# Patient Record
Sex: Male | Born: 1985 | Race: Black or African American | Hispanic: No | Marital: Single | Smoking: Current some day smoker
Health system: Southern US, Community
[De-identification: ages and names within clinical notes are randomized; demographics above are authoritative.]

## PROBLEM LIST (undated history)

## (undated) DIAGNOSIS — K219 Gastro-esophageal reflux disease without esophagitis: Secondary | ICD-10-CM

## (undated) HISTORY — PX: LEG SURGERY: SHX1003

## (undated) HISTORY — DX: Gastro-esophageal reflux disease without esophagitis: K21.9

## (undated) HISTORY — PX: ANKLE SURGERY: SHX546

---

## 2006-01-08 ENCOUNTER — Emergency Department (HOSPITAL_COMMUNITY): Admission: EM | Admit: 2006-01-08 | Discharge: 2006-01-08 | Payer: Self-pay | Admitting: Emergency Medicine

## 2010-01-10 ENCOUNTER — Emergency Department (HOSPITAL_COMMUNITY): Admission: EM | Admit: 2010-01-10 | Discharge: 2010-01-10 | Payer: Self-pay | Admitting: Emergency Medicine

## 2010-09-08 ENCOUNTER — Emergency Department (HOSPITAL_COMMUNITY)
Admission: EM | Admit: 2010-09-08 | Discharge: 2010-09-09 | Payer: Self-pay | Source: Home / Self Care | Admitting: Emergency Medicine

## 2011-07-20 ENCOUNTER — Emergency Department: Payer: Self-pay | Admitting: Emergency Medicine

## 2011-07-29 ENCOUNTER — Emergency Department: Payer: Self-pay | Admitting: Emergency Medicine

## 2012-02-29 ENCOUNTER — Emergency Department (HOSPITAL_COMMUNITY)
Admission: EM | Admit: 2012-02-29 | Discharge: 2012-02-29 | Disposition: A | Discharge status: Home / Self Care | Payer: Self-pay | Attending: Emergency Medicine | Admitting: Emergency Medicine

## 2012-02-29 ENCOUNTER — Encounter (HOSPITAL_COMMUNITY): Payer: Self-pay | Admitting: *Deleted

## 2012-02-29 DIAGNOSIS — I889 Nonspecific lymphadenitis, unspecified: Secondary | ICD-10-CM | POA: Insufficient documentation

## 2012-02-29 DIAGNOSIS — F17200 Nicotine dependence, unspecified, uncomplicated: Secondary | ICD-10-CM | POA: Insufficient documentation

## 2012-02-29 MED ORDER — IBUPROFEN 800 MG PO TABS: 800.0000 mg | ORAL_TABLET | Freq: Three times a day (TID) | ORAL | Status: AC

## 2012-02-29 MED ORDER — SULFAMETHOXAZOLE-TRIMETHOPRIM 800-160 MG PO TABS: 1.0000 | ORAL_TABLET | Freq: Two times a day (BID) | ORAL | Status: AC

## 2012-02-29 MED ORDER — CEPHALEXIN 500 MG PO CAPS: 500.0000 mg | ORAL_CAPSULE | Freq: Four times a day (QID) | ORAL | Status: AC

## 2012-02-29 NOTE — ED Notes (Signed)
Abscess to right groin x 1 wk.

## 2012-03-01 NOTE — ED Provider Notes (Signed)
History     CSN: [id number]  Arrival date & time 02/29/12  1402   First MD Initiated Contact with Patient 02/29/12 1416      Chief Complaint  Patient presents with  . Abscess    (Consider location/radiation/quality/duration/timing/severity/associated sxs/prior treatment) Patient is a 26 y.o. male presenting with abscess. The history is provided by the patient.  Abscess  This is a new problem. The current episode started more than one week ago. The onset was gradual. The problem has been gradually worsening. The abscess is present on the groin. The problem is moderate. The abscess is characterized by painfulness and swelling. Pertinent negatives include no fever. Associated symptoms comments: Also denies nausea, vomiting or weakness.  He does have a history of prior abscess in his left groin which healed spontaneously.Marland Kitchen    History reviewed. No pertinent past medical history.  Past Surgical History  Procedure Date  . Leg surgery left  . Ankle surgery right    No family history on file.  History  Substance Use Topics  . Smoking status: Current Everyday Smoker    Types: Cigarettes  . Smokeless tobacco: Not on file  . Alcohol Use: Yes     occasional      Review of Systems  Constitutional: Negative for fever and chills.  HENT: Negative for facial swelling.   Respiratory: Negative for shortness of breath and wheezing.   Skin: Positive for wound. Negative for color change.  Neurological: Negative for numbness.    Allergies  Review of patient's allergies indicates no known allergies.  Home Medications   Current Outpatient Rx  Name Route Sig Dispense Refill  . CEPHALEXIN 500 MG PO CAPS Oral Take 1 capsule (500 mg total) by mouth 4 (four) times daily. 28 capsule 0  . IBUPROFEN 800 MG PO TABS Oral Take 1 tablet (800 mg total) by mouth 3 (three) times daily. 21 tablet 0  . SULFAMETHOXAZOLE-TRIMETHOPRIM 800-160 MG PO TABS Oral Take 1 tablet by mouth every 12 (twelve) hours.  20 tablet 0    BP 111/74  Pulse 82  Temp 98 F (36.7 C) (Oral)  Resp 16  Ht 5\' 6"  (1.676 m)  Wt 180 lb (81.647 kg)  BMI 29.05 kg/m2  SpO2 100%  Physical Exam  Constitutional: He is oriented to person, place, and time. He appears well-developed and well-nourished.  HENT:  Head: Normocephalic.  Cardiovascular: Normal rate.   Pulmonary/Chest: Effort normal.  Musculoskeletal: He exhibits tenderness.  Neurological: He is alert and oriented to person, place, and time. No sensory deficit.  Skin:       Tender induration of 1.5 cm located in the right medial upper thigh without red streaking or surrounding erythema.  Also no tenting, pointing or active drainage.  Informal bedside ultrasound revealing for no evidence of a pus pocket collection but noted a lobulated structure suggestive of possible reactive lymph node.    ED Course  Procedures (including critical care time)  Labs Reviewed - No data to display No results found.   1. Inguinal lymphadenitis       MDM  Patient placed on Septra DS, Keflex and also given prescription for ibuprofen for pain relief.  He was encouraged to continue warm compresses to help facilitate healing process.  Patient also advised that if symptoms persist or worsen or not completely resolved within the next one to 2 weeks he should get this rechecked either by seeing PCP or return here for reevaluation.  Patient understands plan and will return  if this pain and swelling do not completely resolve.        Burgess Amor, [place] 03/01/12 [phone]

## 2012-03-02 NOTE — ED Provider Notes (Signed)
Medical screening examination/treatment/procedure(s) were performed by non-physician practitioner and as supervising physician I was immediately available for consultation/collaboration.   Murlean Seelye, MD 03/02/12 1315 

## 2012-03-10 ENCOUNTER — Encounter (HOSPITAL_COMMUNITY): Payer: Self-pay | Admitting: *Deleted

## 2012-03-10 ENCOUNTER — Emergency Department (HOSPITAL_COMMUNITY)
Admission: EM | Admit: 2012-03-10 | Discharge: 2012-03-10 | Disposition: A | Discharge status: Home / Self Care | Payer: Self-pay | Attending: Emergency Medicine | Admitting: Emergency Medicine

## 2012-03-10 ENCOUNTER — Emergency Department (HOSPITAL_COMMUNITY): Payer: Self-pay

## 2012-03-10 DIAGNOSIS — F17200 Nicotine dependence, unspecified, uncomplicated: Secondary | ICD-10-CM | POA: Insufficient documentation

## 2012-03-10 DIAGNOSIS — S9032XA Contusion of left foot, initial encounter: Secondary | ICD-10-CM

## 2012-03-10 DIAGNOSIS — M79609 Pain in unspecified limb: Secondary | ICD-10-CM | POA: Insufficient documentation

## 2012-03-10 DIAGNOSIS — S9030XA Contusion of unspecified foot, initial encounter: Secondary | ICD-10-CM | POA: Insufficient documentation

## 2012-03-10 DIAGNOSIS — W208XXA Other cause of strike by thrown, projected or falling object, initial encounter: Secondary | ICD-10-CM | POA: Insufficient documentation

## 2012-03-10 DIAGNOSIS — Z79899 Other long term (current) drug therapy: Secondary | ICD-10-CM | POA: Insufficient documentation

## 2012-03-10 MED ORDER — HYDROCODONE-ACETAMINOPHEN 5-325 MG PO TABS: 1.0000 | ORAL_TABLET | ORAL | Status: DC | PRN

## 2012-03-10 MED ORDER — IBUPROFEN 800 MG PO TABS: 800.0000 mg | ORAL_TABLET | Freq: Three times a day (TID) | ORAL | Status: AC | PRN

## 2012-03-10 MED ORDER — HYDROCODONE-ACETAMINOPHEN 5-325 MG PO TABS
1.0000 | ORAL_TABLET | Freq: Once | ORAL | Status: AC
  Administered 2012-03-10: 1 via ORAL
  Filled 2012-03-10: qty 1

## 2012-03-10 MED ORDER — IBUPROFEN 800 MG PO TABS
800.0000 mg | ORAL_TABLET | Freq: Once | ORAL | Status: AC
  Administered 2012-03-10: 800 mg via ORAL
  Filled 2012-03-10: qty 1

## 2012-03-10 MED ORDER — IBUPROFEN 800 MG PO TABS: 800.0000 mg | ORAL_TABLET | Freq: Once | ORAL | Status: DC

## 2012-03-10 MED ORDER — HYDROCODONE-ACETAMINOPHEN 5-325 MG PO TABS: 1.0000 | ORAL_TABLET | ORAL | Status: AC | PRN

## 2012-03-10 NOTE — ED Notes (Signed)
Pt alert & oriented x4, stable gait. Patient given discharge instructions, paperwork & prescription(s). Patient  instructed to stop at the registration desk to finish any additional paperwork. Patient verbalized understanding. Pt left department w/ no further questions. 

## 2012-03-10 NOTE — ED Notes (Signed)
Pt reports was lifting a concrete slab and it fell on his left foot.  Reports foot is swollen.  No numbness.  Reports throbbing pain.

## 2012-03-10 NOTE — ED Notes (Signed)
Ice pack applied to left foot. 

## 2012-03-11 NOTE — ED Provider Notes (Signed)
History     CSN: [id number]  Arrival date & time 03/10/12  4098   First MD Initiated Contact with Patient 03/10/12 2009      Chief Complaint  Patient presents with  . Foot Injury    (Consider location/radiation/quality/duration/timing/severity/associated sxs/prior treatment) Patient is a 26 y.o. male presenting with foot injury. The history is provided by the patient.  Foot Injury  The incident occurred less than 1 hour ago. The incident occurred at home. The injury mechanism was a direct blow (He dropped a concrete well cover on his left foot). The pain is present in the left foot. The quality of the pain is described as sharp and throbbing. The pain is at a severity of 8/10. The pain is moderate. The pain has been constant since onset. Pertinent negatives include no numbness, no loss of sensation and no tingling. Associated symptoms comments: He has pain localize to his arch area,  He can bear weight if he steps on the "outside" of his foot.Marland Kitchen He reports no foreign bodies present. The symptoms are aggravated by bearing weight and palpation.    History reviewed. No pertinent past medical history.  Past Surgical History  Procedure Date  . Leg surgery left  . Ankle surgery right    History reviewed. No pertinent family history.  History  Substance Use Topics  . Smoking status: Current Everyday Smoker    Types: Cigarettes  . Smokeless tobacco: Not on file  . Alcohol Use: Yes     occasional      Review of Systems  Musculoskeletal: Positive for joint swelling and arthralgias.  Skin: Negative for wound.  Neurological: Negative for tingling, weakness and numbness.    Allergies  Review of patient's allergies indicates no known allergies.  Home Medications   Current Outpatient Rx  Name Route Sig Dispense Refill  . CEPHALEXIN 500 MG PO CAPS Oral Take 1 capsule (500 mg total) by mouth 4 (four) times daily. 28 capsule 0  . IBUPROFEN 800 MG PO TABS Oral Take 1 tablet (800 mg  total) by mouth 3 (three) times daily. 21 tablet 0  . SULFAMETHOXAZOLE-TRIMETHOPRIM 800-160 MG PO TABS Oral Take 1 tablet by mouth every 12 (twelve) hours. 20 tablet 0  . HYDROCODONE-ACETAMINOPHEN 5-325 MG PO TABS Oral Take 1 tablet by mouth every 4 (four) hours as needed for pain. 20 tablet 0  . IBUPROFEN 800 MG PO TABS Oral Take 1 tablet (800 mg total) by mouth every 8 (eight) hours as needed (swelling). 15 tablet 0    BP 119/71  Pulse 99  Temp 98.4 F (36.9 C) (Oral)  Resp 18  Ht 5\' 6"  (1.676 m)  Wt 180 lb (81.647 kg)  BMI 29.05 kg/m2  SpO2 99%  Physical Exam  Constitutional: He appears well-developed and well-nourished.  HENT:  Head: Atraumatic.  Neck: Normal range of motion.  Cardiovascular:       Pulses equal bilaterally  Musculoskeletal: He exhibits edema and tenderness.       Left foot: He exhibits tenderness, bony tenderness and swelling. He exhibits normal range of motion, normal capillary refill, no crepitus and no deformity.       Feet:  Neurological: He is alert. He has normal strength. He displays normal reflexes. No sensory deficit.       Equal strength  Skin: Skin is warm and dry.  Psychiatric: He has a normal mood and affect.    ED Course  Procedures (including critical care time)  Labs Reviewed - No  data to display Dg Foot Complete Left  03/10/2012  *RADIOLOGY REPORT*  Clinical Data: Question injury with first metatarsal pain.  LEFT FOOT - COMPLETE 3+ VIEW  Comparison: None.  Findings: Postoperative changes are centered at the first tarsometatarsal joint, with a nondisplaced and fractured screw entering the base of the second metatarsal.  Postoperative changes are seen in the distal tibia and fibula as well.  No evidence of an acute fracture.  IMPRESSION: Postoperative changes in the foot without evidence of acute osseous abnormality.  Original Report Authenticated By: Reyes Ivan, M.D.     1. Contusion of left foot       MDM  Patients labs  and/or radiological studies were reviewed during the medical decision making and disposition process. Ace wrap,  Crutches supplied.  RICE,  Ibuprofen and hydrocodone prescribed.  Recheck by ortho if not improved over the next week.  Pt understands plan.        Burgess Amor, [place] 03/11/12 1442

## 2012-03-14 NOTE — ED Provider Notes (Signed)
Medical screening examination/treatment/procedure(s) were performed by non-physician practitioner and as supervising physician I was immediately available for consultation/collaboration. Devoria Albe, MD, Armando Gang   Ward Givens, MD 03/14/12 [phone]

## 2012-05-12 ENCOUNTER — Emergency Department (HOSPITAL_COMMUNITY)
Admission: EM | Admit: 2012-05-12 | Discharge: 2012-05-12 | Disposition: A | Discharge status: Home / Self Care | Payer: Self-pay | Attending: Emergency Medicine | Admitting: Emergency Medicine

## 2012-05-12 ENCOUNTER — Encounter (HOSPITAL_COMMUNITY): Payer: Self-pay | Admitting: *Deleted

## 2012-05-12 DIAGNOSIS — K529 Noninfective gastroenteritis and colitis, unspecified: Secondary | ICD-10-CM

## 2012-05-12 DIAGNOSIS — F17200 Nicotine dependence, unspecified, uncomplicated: Secondary | ICD-10-CM | POA: Insufficient documentation

## 2012-05-12 DIAGNOSIS — K5289 Other specified noninfective gastroenteritis and colitis: Secondary | ICD-10-CM | POA: Insufficient documentation

## 2012-05-12 LAB — CBC WITH DIFFERENTIAL/PLATELET
Basophils Absolute: 0 10*3/uL (ref 0.0–0.1)
Hemoglobin: 15.2 g/dL (ref 13.0–17.0)
Lymphocytes Relative: 12 % (ref 12–46)
Lymphs Abs: 0.4 10*3/uL — ABNORMAL LOW (ref 0.7–4.0)
MCH: 27.4 pg (ref 26.0–34.0)
MCHC: 34.2 g/dL (ref 30.0–36.0)
Monocytes Absolute: 0.5 10*3/uL (ref 0.1–1.0)
WBC: 3.7 10*3/uL — ABNORMAL LOW (ref 4.0–10.5)

## 2012-05-12 LAB — BASIC METABOLIC PANEL
Creatinine, Ser: 1.26 mg/dL (ref 0.50–1.35)
GFR calc Af Amer: >90 mL/min (ref >90)
Potassium: 3.1 mEq/L — ABNORMAL LOW (ref 3.5–5.1)

## 2012-05-12 MED ORDER — PROMETHAZINE HCL 25 MG PO TABS: 25.0000 mg | ORAL_TABLET | Freq: Four times a day (QID) | ORAL | Status: DC | PRN

## 2012-05-12 MED ORDER — SODIUM CHLORIDE 0.9 % IV BOLUS (SEPSIS)
1000.0000 mL | Freq: Once | INTRAVENOUS | Status: AC
  Administered 2012-05-12: 1000 mL via INTRAVENOUS

## 2012-05-12 MED ORDER — PANTOPRAZOLE SODIUM 40 MG IV SOLR
40.0000 mg | Freq: Once | INTRAVENOUS | Status: AC
  Administered 2012-05-12: 40 mg via INTRAVENOUS
  Filled 2012-05-12: qty 40

## 2012-05-12 MED ORDER — METOCLOPRAMIDE HCL 5 MG/ML IJ SOLN
10.0000 mg | Freq: Once | INTRAMUSCULAR | Status: AC
  Administered 2012-05-12: 10 mg via INTRAVENOUS
  Filled 2012-05-12: qty 2

## 2012-05-12 MED ORDER — ONDANSETRON HCL 4 MG/2ML IJ SOLN
4.0000 mg | Freq: Once | INTRAMUSCULAR | Status: AC
  Administered 2012-05-12: 4 mg via INTRAVENOUS
  Filled 2012-05-12: qty 2

## 2012-05-12 NOTE — ED Provider Notes (Signed)
History  This chart was scribed for Donnetta Hutching, MD by Shari Heritage. The patient was seen in room APA05/APA05. Patient's care was started at 0910.     CSN: [id number]  Arrival date & time 05/12/12  6440   First MD Initiated Contact with Patient 05/12/12 [phone]      Chief Complaint  Patient presents with  . Emesis    The history is provided by the patient. No language interpreter was used.    Brandon Page is a 26 y.o. male who presents to the Emergency Department complaining of persistent emesis onset 3 days ago. Associated symptoms include an occipital HA that radiates to the left side of his neck , diaphoresis, chills, diarrhea, and abdominal pain. He denies hematemesis. Patient states that he has had 4-5 episodes of emesis every day since Tuesday that only occur in the evenings. He says that he has very few symptoms during the day, but when he tries to sleep, he begins feeling nauseated and symptoms begin. He reports no known sick contacts. No pertinent past medical history. Patient is a current everyday smoker.  History reviewed. No pertinent past medical history.  Past Surgical History  Procedure Date  . Leg surgery left  . Ankle surgery right    No family history on file.  History  Substance Use Topics  . Smoking status: Current Every Day Smoker    Types: Cigarettes  . Smokeless tobacco: Not on file  . Alcohol Use: Yes     occasional      Review of Systems A complete 10 system review of systems was obtained and all systems are negative except as noted in the HPI and PMH.   Allergies  Review of patient's allergies indicates no known allergies.  Home Medications  No current outpatient prescriptions on file.  BP 120/74  Pulse 120  Temp 99 F (37.2 C) (Oral)  Resp 16  Ht 5\' 6"  (1.676 m)  Wt 180 lb (81.647 kg)  BMI 29.05 kg/m2  SpO2 96%  Physical Exam  Nursing note and vitals reviewed. Constitutional: He is oriented to person, place, and time. He appears  well-developed and well-nourished.  HENT:  Head: Normocephalic and atraumatic.  Eyes: Conjunctivae normal and EOM are normal. Pupils are equal, round, and reactive to light.  Neck: Normal range of motion. Neck supple.  Cardiovascular: Normal rate, regular rhythm and normal heart sounds.   Pulmonary/Chest: Effort normal and breath sounds normal.  Abdominal: Soft. Bowel sounds are normal.  Musculoskeletal: Normal range of motion.  Neurological: He is alert and oriented to person, place, and time.  Skin: Skin is warm and dry.  Psychiatric: He has a normal mood and affect.    ED Course  Procedures (including critical care time) DIAGNOSTIC STUDIES: Oxygen Saturation is 96% on room air, adequate by my interpretation.    COORDINATION OF CARE: 9:55am- Patient informed of current plan for treatment and evaluation and agrees with plan at this time. Will administer IV fluids, Zofran, Protonix and Reglan.   Results for orders placed during the hospital encounter of 05/12/12  CBC WITH DIFFERENTIAL      Component Value Range   WBC 3.7 (*) 4.0 - 10.5 K/uL   RBC 5.54  4.22 - 5.81 MIL/uL   Hemoglobin 15.2  13.0 - 17.0 g/dL   HCT 25.9  56.3 - 87.5 %   MCV 80.1  78.0 - 100.0 fL   MCH 27.4  26.0 - 34.0 pg   MCHC 34.2  30.0 -  36.0 g/dL   RDW 19.1  47.8 - 29.5 %   Platelets 192  150 - 400 K/uL   Neutrophils Relative 74  43 - 77 %   Neutro Abs 2.7  1.7 - 7.7 K/uL   Lymphocytes Relative 12  12 - 46 %   Lymphs Abs 0.4 (*) 0.7 - 4.0 K/uL   Monocytes Relative 14 (*) 3 - 12 %   Monocytes Absolute 0.5  0.1 - 1.0 K/uL   Eosinophils Relative 0  0 - 5 %   Eosinophils Absolute 0.0  0.0 - 0.7 K/uL   Basophils Relative 0  0 - 1 %   Basophils Absolute 0.0  0.0 - 0.1 K/uL  BASIC METABOLIC PANEL      Component Value Range   Sodium 133 (*) 135 - 145 mEq/L   Potassium 3.1 (*) 3.5 - 5.1 mEq/L   Chloride 94 (*) 96 - 112 mEq/L   CO2 26  19 - 32 mEq/L   Glucose, Bld 141 (*) 70 - 99 mg/dL   BUN 10  6 - 23  mg/dL   Creatinine, Ser 6.21  0.50 - 1.35 mg/dL   Calcium 9.4  8.4 - 30.8 mg/dL   GFR calc non Af Amer 78 (*) >90 mL/min   GFR calc Af Amer >90  >90 mL/min    No results found.   No diagnosis found.    MDM  No clinical evidence of meningitis. Patient feeling better after IV fluids.discharge home with Phenergan      I personally performed the services described in this documentation, which was scribed in my presence. The recorded information has been reviewed and considered.    Donnetta Hutching, MD 05/12/12 1356

## 2012-05-12 NOTE — ED Notes (Signed)
Pt states fever, chills, vomiting (only at night), and diarrhea since Tuesday. Pain to back of neck and shoulders.

## 2012-08-28 ENCOUNTER — Encounter (HOSPITAL_COMMUNITY): Payer: Self-pay

## 2012-08-28 ENCOUNTER — Emergency Department (HOSPITAL_COMMUNITY)
Admission: EM | Admit: 2012-08-28 | Discharge: 2012-08-29 | Disposition: A | Discharge status: Home / Self Care | Payer: Self-pay | Attending: Emergency Medicine | Admitting: Emergency Medicine

## 2012-08-28 DIAGNOSIS — M2550 Pain in unspecified joint: Secondary | ICD-10-CM | POA: Insufficient documentation

## 2012-08-28 DIAGNOSIS — L02414 Cutaneous abscess of left upper limb: Secondary | ICD-10-CM

## 2012-08-28 DIAGNOSIS — F17200 Nicotine dependence, unspecified, uncomplicated: Secondary | ICD-10-CM | POA: Insufficient documentation

## 2012-08-28 DIAGNOSIS — IMO0002 Reserved for concepts with insufficient information to code with codable children: Secondary | ICD-10-CM | POA: Insufficient documentation

## 2012-08-28 DIAGNOSIS — Z7982 Long term (current) use of aspirin: Secondary | ICD-10-CM | POA: Insufficient documentation

## 2012-08-28 MED ORDER — SULFAMETHOXAZOLE-TMP DS 800-160 MG PO TABS
1.0000 | ORAL_TABLET | Freq: Once | ORAL | Status: AC
  Administered 2012-08-29: 1 via ORAL
  Filled 2012-08-28: qty 1

## 2012-08-28 MED ORDER — CEFTRIAXONE SODIUM 1 G IJ SOLR
1.0000 g | Freq: Once | INTRAMUSCULAR | Status: AC
  Administered 2012-08-29: 1 g via INTRAMUSCULAR
  Filled 2012-08-28: qty 10

## 2012-08-28 MED ORDER — PROMETHAZINE HCL 12.5 MG PO TABS
12.5000 mg | ORAL_TABLET | Freq: Once | ORAL | Status: AC
  Administered 2012-08-29: 12.5 mg via ORAL
  Filled 2012-08-28: qty 1

## 2012-08-28 MED ORDER — HYDROCODONE-ACETAMINOPHEN 5-325 MG PO TABS
2.0000 | ORAL_TABLET | Freq: Once | ORAL | Status: AC
  Administered 2012-08-29: 2 via ORAL
  Filled 2012-08-28: qty 2

## 2012-08-28 NOTE — ED Notes (Signed)
Pt c/o abscess to left forearm that started this morning.

## 2012-08-29 MED ORDER — AMOXICILLIN 500 MG PO CAPS: 500.0000 mg | ORAL_CAPSULE | Freq: Three times a day (TID) | ORAL | Status: DC

## 2012-08-29 MED ORDER — SULFAMETHOXAZOLE-TRIMETHOPRIM 800-160 MG PO TABS: 1.0000 | ORAL_TABLET | Freq: Two times a day (BID) | ORAL | Status: DC

## 2012-08-29 MED ORDER — HYDROCODONE-ACETAMINOPHEN 5-325 MG PO TABS: ORAL_TABLET | ORAL | Status: DC

## 2012-08-29 MED ORDER — LIDOCAINE HCL (PF) 1 % IJ SOLN
5.0000 mL | Freq: Once | INTRAMUSCULAR | Status: AC
  Administered 2012-08-29: 2.2 mL

## 2012-08-29 NOTE — ED Notes (Signed)
No reaction to medication administered - home with significant other

## 2012-08-29 NOTE — ED Provider Notes (Signed)
History     CSN: [id number]  Arrival date & time 08/28/12  2232   First MD Initiated Contact with Patient 08/28/12 2349      Chief Complaint  Patient presents with  . Abscess    (Consider location/radiation/quality/duration/timing/severity/associated sxs/prior treatment) Patient is a 26 y.o. male presenting with abscess. The history is provided by the patient.  Abscess  This is a new problem. The current episode started yesterday. The problem occurs continuously. The problem has been gradually worsening. The abscess is present on the left arm. The problem is moderate. The abscess is characterized by redness and painfulness. It is unknown what he was exposed to. Pertinent negatives include no fever, no vomiting and no cough. His past medical history does not include skin abscesses in family. There were no sick contacts.    History reviewed. No pertinent past medical history.  Past Surgical History  Procedure Date  . Leg surgery left  . Ankle surgery right    No family history on file.  History  Substance Use Topics  . Smoking status: Current Every Day Smoker    Types: Cigarettes  . Smokeless tobacco: Not on file  . Alcohol Use: Yes     Comment: occasional      Review of Systems  Constitutional: Negative for fever and activity change.       All ROS Neg except as noted in HPI  HENT: Negative for nosebleeds and neck pain.   Eyes: Negative for photophobia and discharge.  Respiratory: Negative for cough, shortness of breath and wheezing.   Cardiovascular: Negative for chest pain and palpitations.  Gastrointestinal: Negative for vomiting, abdominal pain and blood in stool.  Genitourinary: Negative for dysuria, frequency and hematuria.  Musculoskeletal: Positive for arthralgias. Negative for back pain.  Skin: Negative.        abscess  Neurological: Negative for dizziness, seizures and speech difficulty.  Psychiatric/Behavioral: Negative for hallucinations and confusion.     Allergies  Review of patient's allergies indicates no known allergies.  Home Medications   Current Outpatient Rx  Name  Route  Sig  Dispense  Refill  . ASPIRIN-ACETAMINOPHEN-CAFFEINE 250-250-65 MG PO TABS   Oral   Take 2 tablets by mouth every 6 (six) hours as needed. For pain         . THERAFLU FLU/COLD PO   Oral   Take 1 Package by mouth daily as needed. For cold symptoms         . PROMETHAZINE HCL 25 MG PO TABS   Oral   Take 1 tablet (25 mg total) by mouth every 6 (six) hours as needed for nausea.   15 tablet   0     BP 131/79  Pulse 86  Temp 98 F (36.7 C) (Oral)  Resp 18  SpO2 100%  Physical Exam  Nursing note and vitals reviewed. Constitutional: He is oriented to person, place, and time. He appears well-developed and well-nourished.  Non-toxic appearance.  HENT:  Head: Normocephalic.  Right Ear: Tympanic membrane and external ear normal.  Left Ear: Tympanic membrane and external ear normal.  Eyes: EOM and lids are normal. Pupils are equal, round, and reactive to light.  Neck: Normal range of motion. Neck supple. Carotid bruit is not present.  Cardiovascular: Normal rate, regular rhythm, normal heart sounds, intact distal pulses and normal pulses.   Pulmonary/Chest: Breath sounds normal. No respiratory distress.  Abdominal: Soft. Bowel sounds are normal. There is no tenderness. There is no guarding.  Musculoskeletal: Normal  range of motion.       Small abscess of the left mid forearm. Small area of redness around the abscess. Soreness of the bicep/tricep area with movement.  Lymphadenopathy:       Head (right side): No submandibular adenopathy present.       Head (left side): No submandibular adenopathy present.    He has no cervical adenopathy.  Neurological: He is alert and oriented to person, place, and time. He has normal strength. No cranial nerve deficit or sensory deficit.  Skin: Skin is warm and dry.  Psychiatric: He has a normal mood and  affect. His speech is normal.    ED Course  Procedures (including critical care time)  Labs Reviewed - No data to display No results found.   No diagnosis found.    MDM  I have reviewed nursing notes, vital signs, and all appropriate lab and imaging results for this patient. Pt has a small abscess with small area of cellulitis around it. Pt advised to use septra and amoxil daily. Norco for pain if needed. Pt is to use salt water soaks daily. He is to return if any changes or problem.       Kathie Dike, [place] 08/29/12 [phone]

## 2012-09-01 NOTE — ED Provider Notes (Signed)
Medical screening examination/treatment/procedure(s) were performed by non-physician practitioner and as supervising physician I was immediately available for consultation/collaboration.  Jacqulynn Shappell, MD 09/01/12 0141 

## 2013-06-20 IMAGING — CR DG FOOT COMPLETE 3+V*L*
3 series · 3 of 3 positions shown · non-contrast
Comparison: None.

CLINICAL DATA: Question injury with first metatarsal pain.

LEFT FOOT - COMPLETE 3+ VIEW

[view not recorded (1 of 3)]
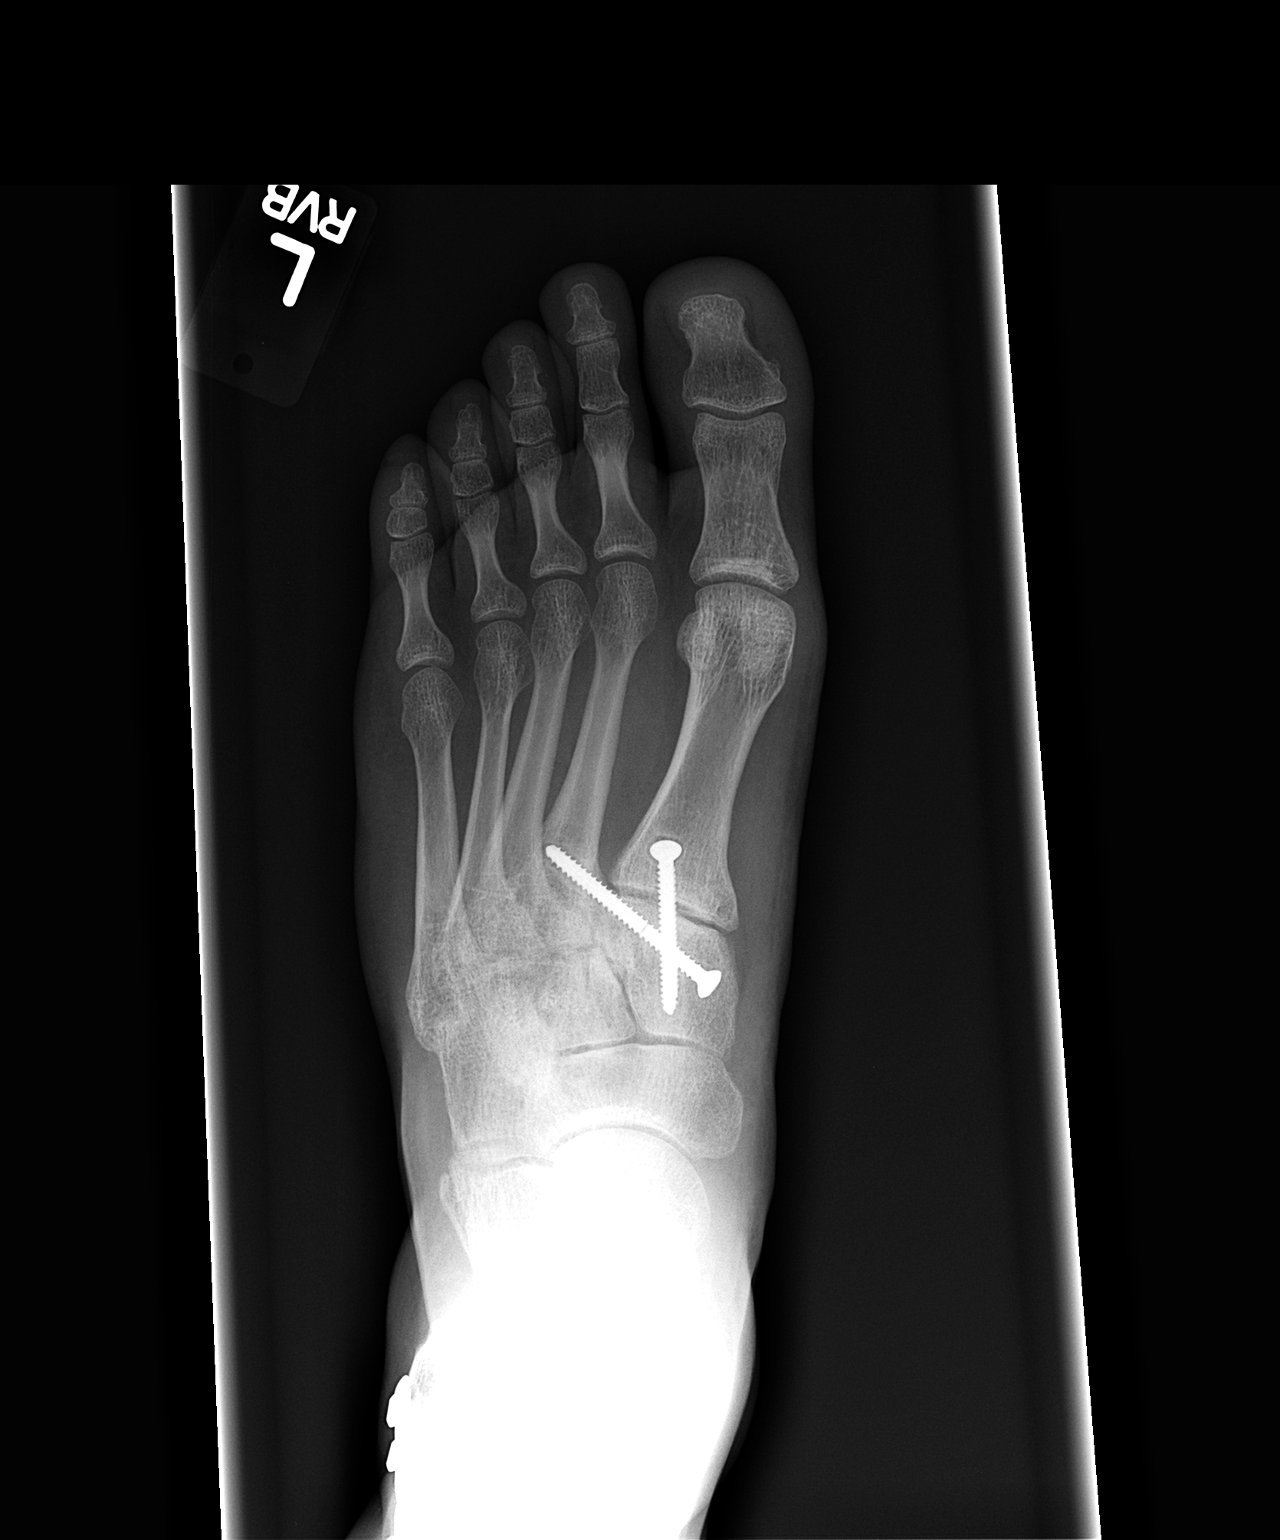

[view not recorded (2 of 3)]
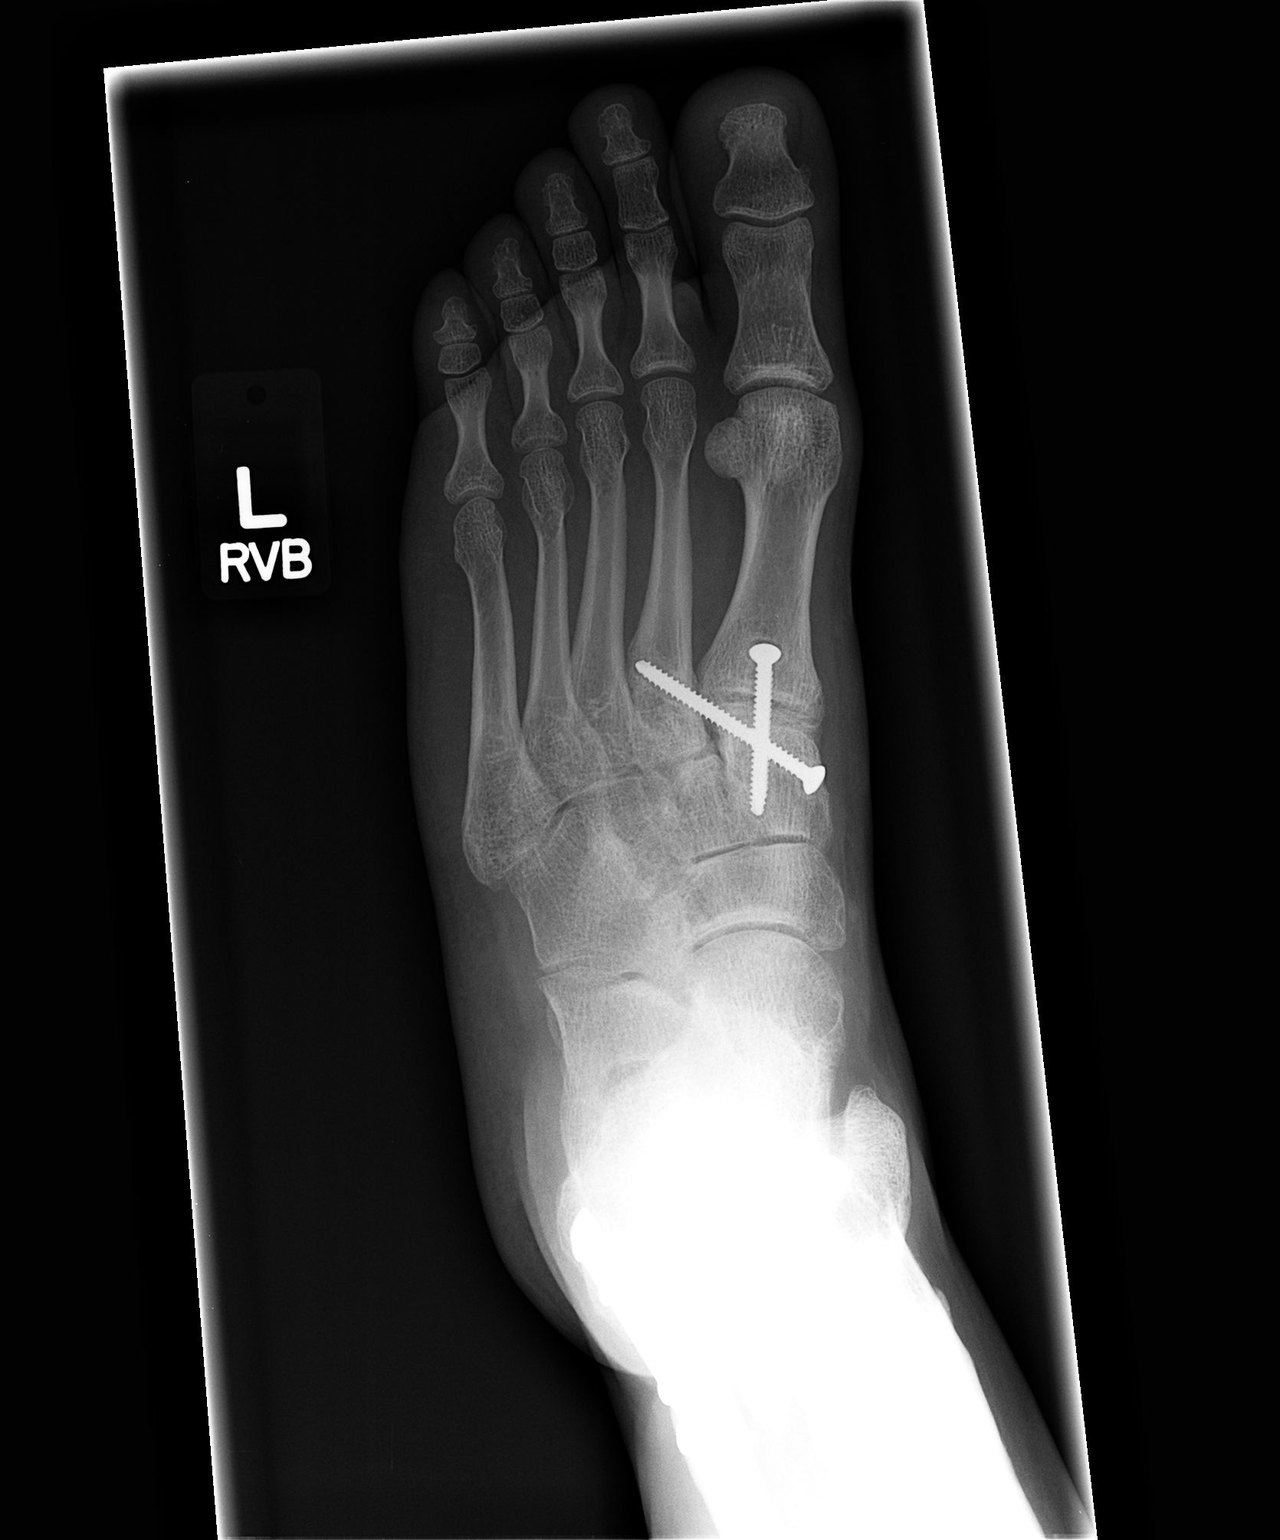

[view not recorded (3 of 3)]
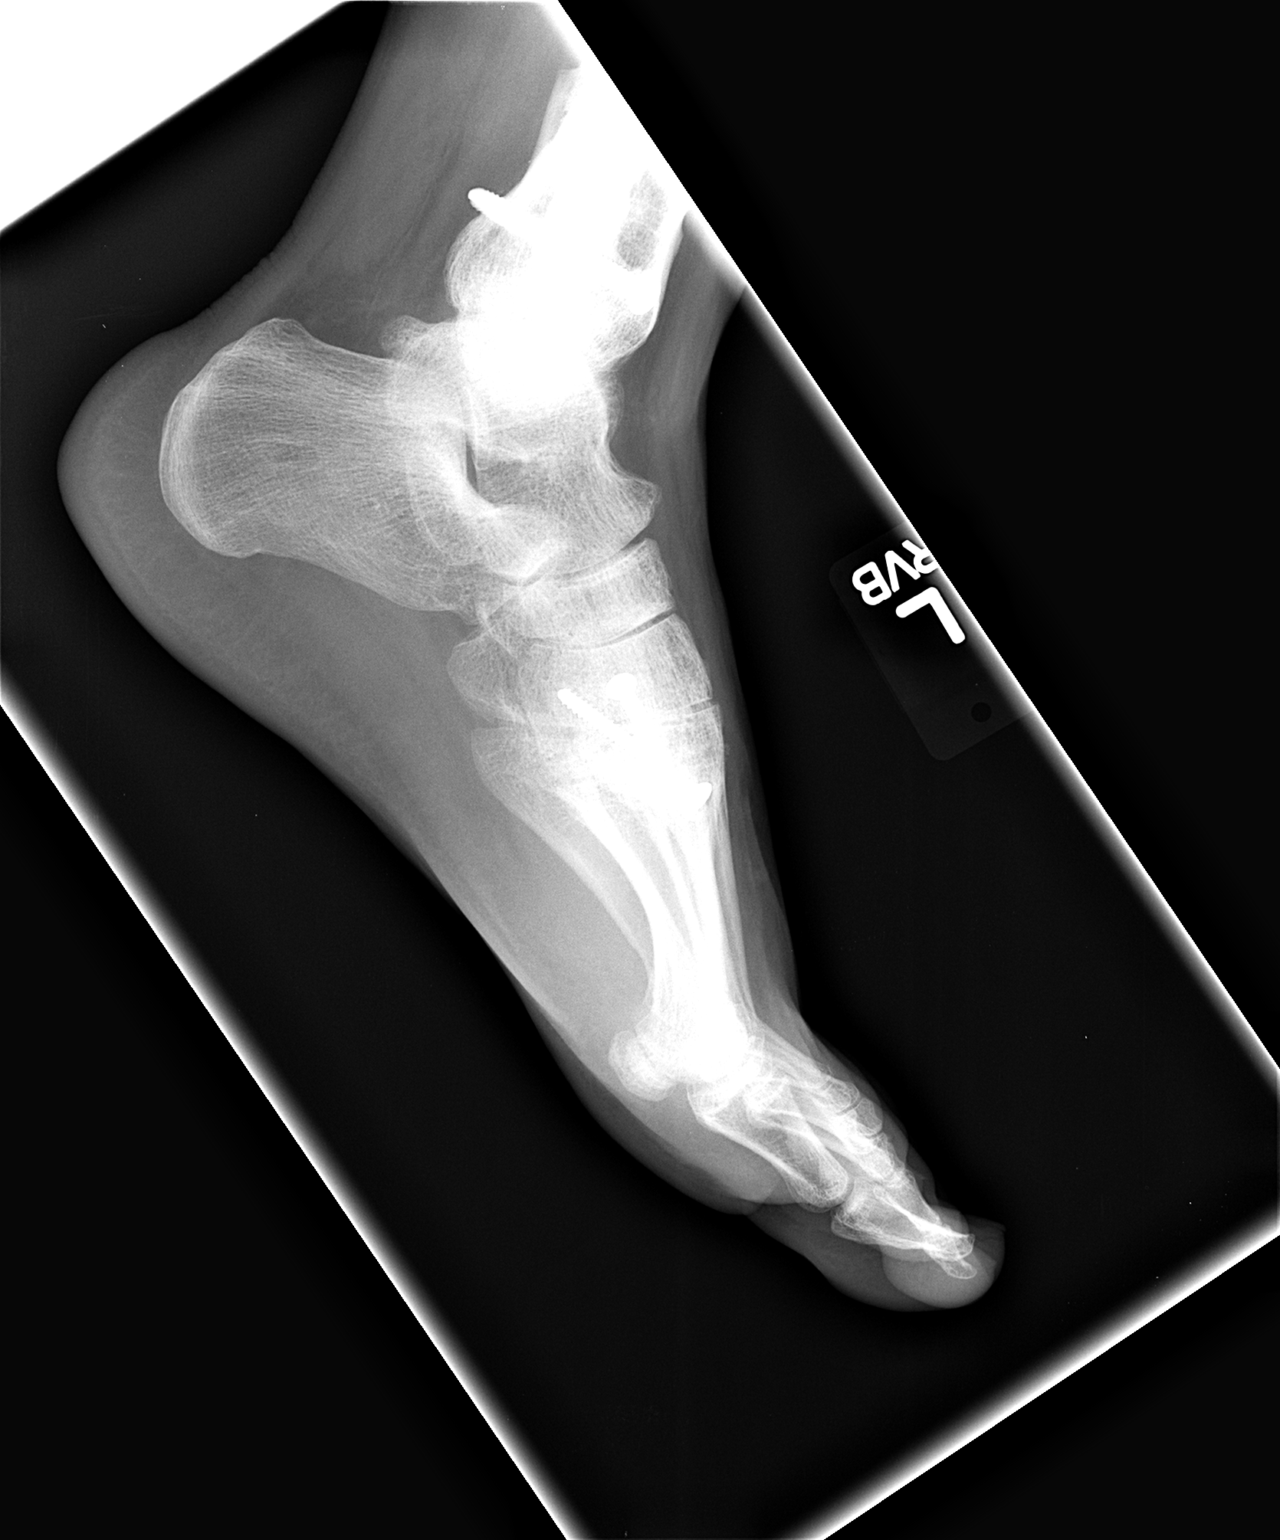

[3 of 3 positions shown; findings below may reference images not displayed]

FINDINGS: Postoperative changes are centered at the first
tarsometatarsal joint, with a nondisplaced and fractured screw
entering the base of the second metatarsal.  Postoperative changes
are seen in the distal tibia and fibula as well.  No evidence of an
acute fracture.
IMPRESSION: Postoperative changes in the foot without evidence of acute osseous
abnormality.

## 2014-02-10 ENCOUNTER — Emergency Department: Payer: Self-pay | Admitting: Emergency Medicine

## 2017-03-23 ENCOUNTER — Emergency Department
Admission: EM | Admit: 2017-03-23 | Discharge: 2017-03-23 | Disposition: A | Discharge status: Home / Self Care | Payer: Self-pay | Attending: Emergency Medicine | Admitting: Emergency Medicine

## 2017-03-23 ENCOUNTER — Encounter: Payer: Self-pay | Admitting: Emergency Medicine

## 2017-03-23 DIAGNOSIS — F17210 Nicotine dependence, cigarettes, uncomplicated: Secondary | ICD-10-CM | POA: Insufficient documentation

## 2017-03-23 DIAGNOSIS — B354 Tinea corporis: Secondary | ICD-10-CM | POA: Insufficient documentation

## 2017-03-23 MED ORDER — HYDROXYZINE HCL 50 MG PO TABS: 50.0000 mg | ORAL_TABLET | Freq: Three times a day (TID) | ORAL | 0 refills | Status: DC | PRN

## 2017-03-23 MED ORDER — CLOTRIMAZOLE-BETAMETHASONE 1-0.05 % EX CREA: TOPICAL_CREAM | CUTANEOUS | 1 refills | Status: DC

## 2017-03-23 NOTE — ED Provider Notes (Signed)
[facility] Emergency Department Provider Note   ____________________________________________   First MD Initiated Contact with Patient 03/23/17 1457     (approximate)  I have reviewed the triage vital signs and the nursing notes.   HISTORY  Chief Complaint Insect Bite    HPI Brandon Page is a 31 y.o. male patient complaining of a rash inferior umbilicus area for 5 days. Patient suspect insect bite. He stated no relief with hydrocortisone ointment. Patient stated this lesion is very itchy.Denies pain or discharge from the lesion.   History reviewed. No pertinent past medical history.  There are no active problems to display for this patient.   Past Surgical History:  Procedure Laterality Date  . ANKLE SURGERY  right  . LEG SURGERY  left    Prior to Admission medications   Medication Sig Start Date End Date Taking? Authorizing Provider  amoxicillin (AMOXIL) 500 MG capsule Take 1 capsule (500 mg total) by mouth 3 (three) times daily. 08/29/12   Ivery QualeBryant, Hobson, PA-C  aspirin-acetaminophen-caffeine (EXCEDRIN MIGRAINE) [phone] MG per tablet Take 2 tablets by mouth every 6 (six) hours as needed. For pain    [provider]  Chlorphen-Pseudoephed-APAP (THERAFLU FLU/COLD PO) Take 1 Package by mouth daily as needed. For cold symptoms    [provider]  clotrimazole-betamethasone (LOTRISONE) cream Apply to affected area 2 times daily 03/23/17   Joni ReiningSmith, Lexxie Winberg K, PA-C  HYDROcodone-acetaminophen (NORCO/VICODIN) 5-325 MG per tablet 1 or 2 po q4h prn pain 08/29/12   Ivery QualeBryant, Hobson, PA-C  hydrOXYzine (ATARAX/VISTARIL) 50 MG tablet Take 1 tablet (50 mg total) by mouth 3 (three) times daily as needed. 03/23/17   Joni ReiningSmith, Sadler Teschner K, PA-C  promethazine (PHENERGAN) 25 MG tablet Take 1 tablet (25 mg total) by mouth every 6 (six) hours as needed for nausea. 05/12/12 05/19/12  Donnetta Hutchingook, Brian, MD  sulfamethoxazole-trimethoprim (SEPTRA DS) 800-160 MG per  tablet Take 1 tablet by mouth every 12 (twelve) hours. 08/29/12   Ivery QualeBryant, Hobson, PA-C    Allergies Patient has no known allergies.  History reviewed. No pertinent family history.  Social History Social History  Substance Use Topics  . Smoking status: Current Every Day Smoker    Types: Cigarettes  . Smokeless tobacco: Never Used  . Alcohol use Yes     Comment: occasional    Review of Systems Constitutional: No fever/chills Eyes: No visual changes. ENT: No sore throat. Cardiovascular: Denies chest pain. Respiratory: Denies shortness of breath. Gastrointestinal: No abdominal pain.  No nausea, no vomiting.  No diarrhea.  No constipation. Genitourinary: Negative for dysuria. Musculoskeletal: Negative for back pain. Skin:Positive for rash. Neurological: Negative for headaches, focal weakness or numbness.   ____________________________________________   PHYSICAL EXAM:  VITAL SIGNS: ED Triage Vitals  Enc Vitals Group     BP 03/23/17 1418 (!) 150/82     Pulse Rate 03/23/17 1418 65     Resp 03/23/17 1418 18     Temp 03/23/17 1418 98.9 F (37.2 C)     Temp Source 03/23/17 1418 Oral     SpO2 03/23/17 1418 99 %     Weight 03/23/17 1423 179 lb (81.2 kg)     Height 03/23/17 1423 5\' 5"  (1.651 m)     Head Circumference --      Peak Flow --      Pain Score 03/23/17 1423 3     Pain Loc --      Pain Edu? --      Excl. in  GC? --     Constitutional: Alert and oriented. Well appearing and in no acute distress. Cardiovascular: Normal rate, regular rhythm. Grossly normal heart sounds.  Good peripheral circulation. Respiratory: Normal respiratory effort.  No retractions. Lungs CTAB. Neurologic:  Normal speech and language. No gross focal neurologic deficits are appreciated. No gait instability. Skin:  Skin is warm, dry and intact. Single. Back or lesion with scaly borders. Psychiatric: Mood and affect are normal. Speech and behavior are  normal.  ____________________________________________   LABS (all labs ordered are listed, but only abnormal results are displayed)  Labs Reviewed - No data to display ____________________________________________  EKG   ____________________________________________  RADIOLOGY  No results found.  ____________________________________________   PROCEDURES  Procedure(s) performed: None  Procedures  Critical Care performed: No  ____________________________________________   INITIAL IMPRESSION / ASSESSMENT AND PLAN / ED COURSE  Pertinent labs & imaging results that were available during my care of the patient were reviewed by me and considered in my medical decision making (see chart for details).  Tinea Corposis. Trial of Lotrisone and Atarax . Follow up with dermatology clinic if no improvement or worsening of complaint.     ____________________________________________   FINAL CLINICAL IMPRESSION(S) / ED DIAGNOSES  Final diagnoses:  Tinea corporis      NEW MEDICATIONS STARTED DURING THIS VISIT:  New Prescriptions   CLOTRIMAZOLE-BETAMETHASONE (LOTRISONE) CREAM    Apply to affected area 2 times daily   HYDROXYZINE (ATARAX/VISTARIL) 50 MG TABLET    Take 1 tablet (50 mg total) by mouth 3 (three) times daily as needed.     Note:  This document was prepared using Dragon voice recognition software and may include unintentional dictation errors.    Joni ReiningSmith, Sonjia Wilcoxson K, PA-C 03/23/17 1516    Sharman CheekStafford, Phillip, MD 03/25/17 2020

## 2017-03-23 NOTE — Discharge Instructions (Signed)
Use [place] as directed. If no improvement in one week contact dermatology to schedule an appointment. appointment.

## 2017-03-23 NOTE — ED Triage Notes (Signed)
Pt presents to Ed c/o questionable insect bites near his umbilical / mid abdomen x5 days. Pt has used hydrocortisone cream without much relief

## 2018-10-16 ENCOUNTER — Encounter: Payer: Self-pay | Admitting: Emergency Medicine

## 2018-10-16 ENCOUNTER — Emergency Department
Admission: EM | Admit: 2018-10-16 | Discharge: 2018-10-16 | Disposition: A | Discharge status: Home / Self Care | Payer: Self-pay | Attending: Emergency Medicine | Admitting: Emergency Medicine

## 2018-10-16 ENCOUNTER — Other Ambulatory Visit: Payer: Self-pay

## 2018-10-16 DIAGNOSIS — Z79899 Other long term (current) drug therapy: Secondary | ICD-10-CM | POA: Insufficient documentation

## 2018-10-16 DIAGNOSIS — J069 Acute upper respiratory infection, unspecified: Secondary | ICD-10-CM | POA: Insufficient documentation

## 2018-10-16 DIAGNOSIS — J029 Acute pharyngitis, unspecified: Secondary | ICD-10-CM | POA: Insufficient documentation

## 2018-10-16 DIAGNOSIS — F17210 Nicotine dependence, cigarettes, uncomplicated: Secondary | ICD-10-CM | POA: Insufficient documentation

## 2018-10-16 MED ORDER — AMOXICILLIN 500 MG PO CAPS: 500.0000 mg | ORAL_CAPSULE | Freq: Three times a day (TID) | ORAL | 0 refills | Status: DC

## 2018-10-16 MED ORDER — GUAIFENESIN-CODEINE 100-10 MG/5ML PO SOLN: 10.0000 mL | Freq: Three times a day (TID) | ORAL | 0 refills | Status: DC | PRN

## 2018-10-16 NOTE — Discharge Instructions (Signed)
Follow up with the primary care provider of your choice for symptoms that are not improving over the next few days.  Return to the ER for symptoms that change or worsen if unable to schedule an appointment. 

## 2018-10-16 NOTE — ED Provider Notes (Signed)
[facility] Emergency Department Provider Note  ____________________________________________  Time seen: Approximately 11:17 AM  I have reviewed the triage vital signs and the nursing notes.   HISTORY  Chief Complaint Sore Throat; Cough; Nasal Congestion; and Generalized Body Aches   HPI Brandon Page is a 33 y.o. male who presents to the emergency department for treatment and evaluation of sore throat, cough, congestion, and body aches. No relief with Mucinex, Tylenol, Chlorseptic. He denies fever.    History reviewed. No pertinent past medical history.  There are no active problems to display for this patient.   Past Surgical History:  Procedure Laterality Date  . ANKLE SURGERY  right  . LEG SURGERY  left    Prior to Admission medications   Medication Sig Start Date End Date Taking? Authorizing Provider  amoxicillin (AMOXIL) 500 MG capsule Take 1 capsule (500 mg total) by mouth 3 (three) times daily. 10/16/18   Williamson Cavanah, Kasandra Knudsen, FNP  aspirin-acetaminophen-caffeine (EXCEDRIN MIGRAINE) [phone] MG per tablet Take 2 tablets by mouth every 6 (six) hours as needed. For pain    [provider]  Chlorphen-Pseudoephed-APAP (THERAFLU FLU/COLD PO) Take 1 Package by mouth daily as needed. For cold symptoms    [provider]  clotrimazole-betamethasone (LOTRISONE) cream Apply to affected area 2 times daily 03/23/17   Joni Reining, PA-C  guaiFENesin-codeine 100-10 MG/5ML syrup Take 10 mLs by mouth 3 (three) times daily as needed. 10/16/18   Kem Boroughs B, FNP  HYDROcodone-acetaminophen (NORCO/VICODIN) 5-325 MG per tablet 1 or 2 po q4h prn pain 08/29/12   Ivery Quale, PA-C  hydrOXYzine (ATARAX/VISTARIL) 50 MG tablet Take 1 tablet (50 mg total) by mouth 3 (three) times daily as needed. 03/23/17   Joni Reining, PA-C  promethazine (PHENERGAN) 25 MG tablet Take 1 tablet (25 mg total) by mouth every 6 (six) hours as needed for nausea. 05/12/12  05/19/12  Donnetta Hutching, MD  sulfamethoxazole-trimethoprim (SEPTRA DS) 800-160 MG per tablet Take 1 tablet by mouth every 12 (twelve) hours. 08/29/12   Ivery Quale, PA-C    Allergies Patient has no known allergies.  No family history on file.  Social History Social History   Tobacco Use  . Smoking status: Current Every Day Smoker    Types: Cigarettes  . Smokeless tobacco: Never Used  Substance Use Topics  . Alcohol use: Yes    Comment: occasional  . Drug use: Not Currently    Types: Marijuana    Review of Systems Constitutional: Negative for fever/chills. Normal appetite. ENT: Positive for sore throat. Cardiovascular: Denies chest pain. Respiratory: Negative for shortness of breath. Positive for cough. Negative for wheezing.  Gastrointestinal: Negative for nausea,  no vomiting.  Negative for diarrhea.  Musculoskeletal: Negative for body aches Skin: Negative for rash. Neurological: Negative for headaches ____________________________________________   PHYSICAL EXAM:  VITAL SIGNS: ED Triage Vitals  Enc Vitals Group     BP 10/16/18 0957 (!) 139/93     Pulse Rate 10/16/18 0957 79     Resp 10/16/18 0957 16     Temp 10/16/18 0957 98 F (36.7 C)     Temp Source 10/16/18 0957 Oral     SpO2 10/16/18 0957 98 %     Weight 10/16/18 0953 190 lb (86.2 kg)     Height 10/16/18 0953 5\' 6"  (1.676 m)     Head Circumference --      Peak Flow --      Pain Score 10/16/18 0952 9  Pain Loc --      Pain Edu? --      Excl. in GC? --     Constitutional: Alert and oriented. Well appearing and in no acute distress. Eyes: Conjunctivae are normal. Ears: Bilateral TM normal. Nose: Maxillary sinus congestion noted; clear rhinnorhea. Mouth/Throat: Mucous membranes are moist.  Oropharynx erythematous. Tonsils erythematous. Uvula midline. Neck: No stridor.  Lymphatic: No cervical lymphadenopathy. Cardiovascular: Normal rate, regular rhythm. Good peripheral circulation. Respiratory:  Respirations are even and unlabored.  No retractions. Breath sounds clear to auscultation. Gastrointestinal: Soft and nontender.  Musculoskeletal: FROM x 4 extremities.  Neurologic:  Normal speech and language. Skin:  Skin is warm, dry and intact. No rash noted. Psychiatric: Mood and affect are normal. Speech and behavior are normal.  ____________________________________________   LABS (all labs ordered are listed, but only abnormal results are displayed)  Labs Reviewed - No data to display ____________________________________________  EKG  Not indicated. ____________________________________________  RADIOLOGY  Not  ____________________________________________   PROCEDURES  Procedure(s) performed: None  Critical Care performed: No ____________________________________________   INITIAL IMPRESSION / ASSESSMENT AND PLAN / ED COURSE  33 y.o. male who presents to the emergency department for treatment and evaluation of throat, cough, congestion, body aches that have been present for the week.  Patient states that his throat has become more sore over the past 24 hours.  No over-the-counter medications have given him much relief.  He will be treated with amoxicillin Robitussin-AC.  He was advised to follow-up with the primary care provider of his choice for symptoms are not improving over the next few days.  He was advised to return to the emergency department for symptoms change or worsen if unable to schedule an appointment.    Medications - No data to display  ED Discharge Orders         Ordered    amoxicillin (AMOXIL) 500 MG capsule  3 times daily     10/16/18 1130    guaiFENesin-codeine 100-10 MG/5ML syrup  3 times daily PRN     10/16/18 1130           Pertinent labs & imaging results that were available during my care of the patient were reviewed by me and considered in my medical decision making (see chart for details).    If controlled substance prescribed  during this visit, 12 month history viewed on the [place] prior to issuing an initial prescription for Schedule II or III opiod. ____________________________________________   FINAL CLINICAL IMPRESSION(S) / ED DIAGNOSES  Final diagnoses:  Acute pharyngitis, unspecified etiology  Upper respiratory tract infection, unspecified type    Note:  This document was prepared using Dragon voice recognition software and may include unintentional dictation errors.     Chinita Pester, FNP 10/16/18 1833    Nita Sickle, MD 10/17/18 [phone]

## 2018-10-16 NOTE — ED Triage Notes (Signed)
Patient complaining of sore throat, cough, congestion, and body aches.  Taking Mucinex, Nyquil, and Tylenol, Chlorseptic without relief.  Sx X one week.

## 2018-10-21 ENCOUNTER — Other Ambulatory Visit: Payer: Self-pay

## 2018-10-21 ENCOUNTER — Encounter: Payer: Self-pay | Admitting: Emergency Medicine

## 2018-10-21 ENCOUNTER — Emergency Department: Payer: Self-pay

## 2018-10-21 ENCOUNTER — Emergency Department
Admission: EM | Admit: 2018-10-21 | Discharge: 2018-10-21 | Disposition: A | Discharge status: Home / Self Care | Payer: Self-pay | Attending: Student in an Organized Health Care Education/Training Program | Admitting: Student in an Organized Health Care Education/Training Program

## 2018-10-21 DIAGNOSIS — R05 Cough: Secondary | ICD-10-CM

## 2018-10-21 DIAGNOSIS — J209 Acute bronchitis, unspecified: Secondary | ICD-10-CM | POA: Insufficient documentation

## 2018-10-21 DIAGNOSIS — R059 Cough, unspecified: Secondary | ICD-10-CM

## 2018-10-21 DIAGNOSIS — R062 Wheezing: Secondary | ICD-10-CM | POA: Insufficient documentation

## 2018-10-21 DIAGNOSIS — Z79899 Other long term (current) drug therapy: Secondary | ICD-10-CM | POA: Insufficient documentation

## 2018-10-21 DIAGNOSIS — F17210 Nicotine dependence, cigarettes, uncomplicated: Secondary | ICD-10-CM | POA: Insufficient documentation

## 2018-10-21 LAB — CBC WITH DIFFERENTIAL/PLATELET
ABS IMMATURE GRANULOCYTES: 0.07 10*3/uL (ref 0.00–0.07)
BASOS PCT: 0 %
Basophils Absolute: 0.1 10*3/uL (ref 0.0–0.1)
Eosinophils Absolute: 0 10*3/uL (ref 0.0–0.5)
Eosinophils Relative: 0 %
HCT: 39.2 % (ref 39.0–52.0)
HEMOGLOBIN: 12.9 g/dL — AB (ref 13.0–17.0)
IMMATURE GRANULOCYTES: 1 %
LYMPHS PCT: 13 %
Lymphs Abs: 1.8 10*3/uL (ref 0.7–4.0)
MCH: 27.9 pg (ref 26.0–34.0)
MCHC: 32.9 g/dL (ref 30.0–36.0)
MCV: 84.8 fL (ref 80.0–100.0)
Monocytes Absolute: 1.5 10*3/uL — ABNORMAL HIGH (ref 0.1–1.0)
Monocytes Relative: 11 %
NEUTROS ABS: 10.8 10*3/uL — AB (ref 1.7–7.7)
NEUTROS PCT: 75 %
PLATELETS: 288 10*3/uL (ref 150–400)
RBC: 4.62 MIL/uL (ref 4.22–5.81)
RDW: 12.8 % (ref 11.5–15.5)
WBC: 14.3 10*3/uL — ABNORMAL HIGH (ref 4.0–10.5)
nRBC: 0 % (ref 0.0–0.2)

## 2018-10-21 LAB — BRAIN NATRIURETIC PEPTIDE: B Natriuretic Peptide: 20 pg/mL (ref 0.0–100.0)

## 2018-10-21 LAB — COMPREHENSIVE METABOLIC PANEL
ALK PHOS: 69 U/L (ref 38–126)
ALT: 99 U/L — AB (ref 0–44)
AST: 55 U/L — ABNORMAL HIGH (ref 15–41)
Albumin: 4.3 g/dL (ref 3.5–5.0)
Anion gap: 10 (ref 5–15)
BUN: 11 mg/dL (ref 6–20)
CHLORIDE: 101 mmol/L (ref 98–111)
CO2: 27 mmol/L (ref 22–32)
CREATININE: 0.83 mg/dL (ref 0.61–1.24)
Calcium: 8.8 mg/dL — ABNORMAL LOW (ref 8.9–10.3)
GFR calc Af Amer: >60 mL/min (ref >60)
Glucose, Bld: 102 mg/dL — ABNORMAL HIGH (ref 70–99)
Potassium: 3.4 mmol/L — ABNORMAL LOW (ref 3.5–5.1)
Sodium: 138 mmol/L (ref 135–145)
Total Bilirubin: 0.7 mg/dL (ref 0.3–1.2)
Total Protein: 7.7 g/dL (ref 6.5–8.1)

## 2018-10-21 MED ORDER — NAPROXEN 500 MG PO TABS: 500.0000 mg | ORAL_TABLET | Freq: Two times a day (BID) | ORAL | 0 refills | Status: DC

## 2018-10-21 MED ORDER — IPRATROPIUM-ALBUTEROL 0.5-2.5 (3) MG/3ML IN SOLN
3.0000 mL | Freq: Once | RESPIRATORY_TRACT | Status: AC
  Administered 2018-10-21: 3 mL via RESPIRATORY_TRACT
  Filled 2018-10-21: qty 3

## 2018-10-21 MED ORDER — PREDNISONE 20 MG PO TABS: 40.0000 mg | ORAL_TABLET | Freq: Every day | ORAL | 0 refills | Status: AC

## 2018-10-21 MED ORDER — PREDNISONE 20 MG PO TABS
60.0000 mg | ORAL_TABLET | Freq: Once | ORAL | Status: AC
  Administered 2018-10-21: 60 mg via ORAL
  Filled 2018-10-21: qty 3

## 2018-10-21 MED ORDER — AZITHROMYCIN 500 MG PO TABS: 500.0000 mg | ORAL_TABLET | Freq: Every day | ORAL | 0 refills | Status: AC

## 2018-10-21 MED ORDER — ALBUTEROL SULFATE HFA 108 (90 BASE) MCG/ACT IN AERS: 2.0000 | INHALATION_SPRAY | Freq: Four times a day (QID) | RESPIRATORY_TRACT | 2 refills | Status: AC | PRN

## 2018-10-21 MED ORDER — HYDROCOD POLST-CPM POLST ER 10-8 MG/5ML PO SUER
5.0000 mL | Freq: Once | ORAL | Status: AC
  Administered 2018-10-21: 5 mL via ORAL
  Filled 2018-10-21: qty 5

## 2018-10-21 MED ORDER — ALBUTEROL SULFATE (2.5 MG/3ML) 0.083% IN NEBU
2.5000 mg | INHALATION_SOLUTION | Freq: Once | RESPIRATORY_TRACT | Status: AC
  Administered 2018-10-21: 2.5 mg via RESPIRATORY_TRACT
  Filled 2018-10-21: qty 3

## 2018-10-21 NOTE — ED Provider Notes (Signed)
[facility] Emergency Department Provider Note    First MD Initiated Contact with Patient 10/21/18 0915     (approximate)  I have reviewed the triage vital signs and the nursing notes.   HISTORY  Chief Complaint Cough    HPI Brandon Page is a 33 y.o. male below listed past medical history presents the ER for several weeks of progressively worsening cough.  Patient was treated with amoxicillin recently without any improvement.  States he does have a history of bronchitis.  Is not currently smoking.  Has not had any particular chest pain or diaphoresis but does have significant discomfort with the recurrent and persistent cough.  Primarily presenting to the ER as he is not slept in several days due to persistent cough.  Over-the-counter antitussives are not improving symptoms.    History reviewed. No pertinent past medical history. No family history on file. Past Surgical History:  Procedure Laterality Date  . ANKLE SURGERY  right  . LEG SURGERY  left   There are no active problems to display for this patient.     Prior to Admission medications   Medication Sig Start Date End Date Taking? Authorizing Provider  albuterol (PROVENTIL HFA;VENTOLIN HFA) 108 (90 Base) MCG/ACT inhaler Inhale 2 puffs into the lungs every 6 (six) hours as needed for wheezing or shortness of breath. 10/21/18   Willy Eddy, MD  amoxicillin (AMOXIL) 500 MG capsule Take 1 capsule (500 mg total) by mouth 3 (three) times daily. 10/16/18   Triplett, Kasandra Knudsen, FNP  aspirin-acetaminophen-caffeine (EXCEDRIN MIGRAINE) [phone] MG per tablet Take 2 tablets by mouth every 6 (six) hours as needed. For pain    [provider]  azithromycin (ZITHROMAX) 500 MG tablet Take 1 tablet (500 mg total) by mouth daily for 3 days. Take 1 tablet daily for 3 days. 10/21/18 10/24/18  Willy Eddy, MD  Chlorphen-Pseudoephed-APAP [facility] FLU/COLD PO) Take 1 Package by mouth daily as needed.  For cold symptoms    [provider]  clotrimazole-betamethasone (LOTRISONE) cream Apply to affected area 2 times daily 03/23/17   Joni Reining, PA-C  guaiFENesin-codeine 100-10 MG/5ML syrup Take 10 mLs by mouth 3 (three) times daily as needed. 10/16/18   Kem Boroughs B, FNP  HYDROcodone-acetaminophen (NORCO/VICODIN) 5-325 MG per tablet 1 or 2 po q4h prn pain 08/29/12   Ivery Quale, PA-C  hydrOXYzine (ATARAX/VISTARIL) 50 MG tablet Take 1 tablet (50 mg total) by mouth 3 (three) times daily as needed. 03/23/17   Joni Reining, PA-C  naproxen (NAPROSYN) 500 MG tablet Take 1 tablet (500 mg total) by mouth 2 (two) times daily with a meal. 10/21/18 10/21/19  Willy Eddy, MD  predniSONE (DELTASONE) 20 MG tablet Take 2 tablets (40 mg total) by mouth daily for 4 days. 10/21/18 10/25/18  Willy Eddy, MD  promethazine (PHENERGAN) 25 MG tablet Take 1 tablet (25 mg total) by mouth every 6 (six) hours as needed for nausea. 05/12/12 05/19/12  Donnetta Hutching, MD  sulfamethoxazole-trimethoprim (SEPTRA DS) 800-160 MG per tablet Take 1 tablet by mouth every 12 (twelve) hours. 08/29/12   Ivery Quale, PA-C    Allergies Patient has no known allergies.    Social History Social History   Tobacco Use  . Smoking status: Current Every Day Smoker    Types: Cigarettes  . Smokeless tobacco: Never Used  Substance Use Topics  . Alcohol use: Yes    Comment: occasional  . Drug use: Not Currently    Types: Marijuana  Review of Systems Patient denies headaches, rhinorrhea, blurry vision, numbness, shortness of breath, chest pain, edema, cough, abdominal pain, nausea, vomiting, diarrhea, dysuria, fevers, rashes or hallucinations unless otherwise stated above in HPI. ____________________________________________   PHYSICAL EXAM:  VITAL SIGNS: Vitals:   10/21/18 1200 10/21/18 1230  BP: 126/64 121/82  Pulse: 97 97  Resp:    Temp:    SpO2: 98% 95%    Constitutional: Alert and oriented.    Eyes: Conjunctivae are normal.  Head: Atraumatic. Nose: No congestion/rhinnorhea. Mouth/Throat: Mucous membranes are moist.   Neck: No stridor. Painless ROM.  Cardiovascular: Normal rate, regular rhythm. Grossly normal heart sounds.  Good peripheral circulation. Respiratory: Normal respiratory effort.  No retractions. Lungs with end expriatory wheeze through out.  No retractions,  +mild tachypnea Gastrointestinal: Soft and nontender. No distention. No abdominal bruits. No CVA tenderness. Genitourinary:  Musculoskeletal: No lower extremity tenderness nor edema.  No joint effusions. Neurologic:  Normal speech and language. No gross focal neurologic deficits are appreciated. No facial droop Skin:  Skin is warm, dry and intact. No rash noted. Psychiatric: Mood and affect are normal. Speech and behavior are normal.  ____________________________________________   LABS (all labs ordered are listed, but only abnormal results are displayed)  Results for orders placed or performed during the hospital encounter of 10/21/18 (from the past 24 hour(s))  CBC with Differential/Platelet     Status: Abnormal   Collection Time: 10/21/18  9:45 AM  Result Value Ref Range   WBC 14.3 (H) 4.0 - 10.5 K/uL   RBC 4.62 4.22 - 5.81 MIL/uL   Hemoglobin 12.9 (L) 13.0 - 17.0 g/dL   HCT 16.1 09.6 - 04.5 %   MCV 84.8 80.0 - 100.0 fL   MCH 27.9 26.0 - 34.0 pg   MCHC 32.9 30.0 - 36.0 g/dL   RDW 40.9 81.1 - 91.4 %   Platelets 288 150 - 400 K/uL   nRBC 0.0 0.0 - 0.2 %   Neutrophils Relative % 75 %   Neutro Abs 10.8 (H) 1.7 - 7.7 K/uL   Lymphocytes Relative 13 %   Lymphs Abs 1.8 0.7 - 4.0 K/uL   Monocytes Relative 11 %   Monocytes Absolute 1.5 (H) 0.1 - 1.0 K/uL   Eosinophils Relative 0 %   Eosinophils Absolute 0.0 0.0 - 0.5 K/uL   Basophils Relative 0 %   Basophils Absolute 0.1 0.0 - 0.1 K/uL   Immature Granulocytes 1 %   Abs Immature Granulocytes 0.07 0.00 - 0.07 K/uL  Comprehensive metabolic panel      Status: Abnormal   Collection Time: 10/21/18  9:45 AM  Result Value Ref Range   Sodium 138 135 - 145 mmol/L   Potassium 3.4 (L) 3.5 - 5.1 mmol/L   Chloride 101 98 - 111 mmol/L   CO2 27 22 - 32 mmol/L   Glucose, Bld 102 (H) 70 - 99 mg/dL   BUN 11 6 - 20 mg/dL   Creatinine, Ser 7.82 0.61 - 1.24 mg/dL   Calcium 8.8 (L) 8.9 - 10.3 mg/dL   Total Protein 7.7 6.5 - 8.1 g/dL   Albumin 4.3 3.5 - 5.0 g/dL   AST 55 (H) 15 - 41 U/L   ALT 99 (H) 0 - 44 U/L   Alkaline Phosphatase 69 38 - 126 U/L   Total Bilirubin 0.7 0.3 - 1.2 mg/dL   GFR calc non Af Amer >60 >60 mL/min   GFR calc Af Amer >60 >60 mL/min   Anion gap 10  5 - 15  Brain natriuretic peptide     Status: None   Collection Time: 10/21/18  9:45 AM  Result Value Ref Range   B Natriuretic Peptide 20.0 0.0 - 100.0 pg/mL   ____________________________________________ ____________________________________________  RADIOLOGY  I personally reviewed all radiographic images ordered to evaluate for the above acute complaints and reviewed radiology reports and findings.  These findings were personally discussed with the patient.  Please see medical record for radiology report.  ____________________________________________   PROCEDURES  Procedure(s) performed:  Procedures    Critical Care performed: no ____________________________________________   INITIAL IMPRESSION / ASSESSMENT AND PLAN / ED COURSE  Pertinent labs & imaging results that were available during my care of the patient were reviewed by me and considered in my medical decision making (see chart for details).   DDX: Bronchitis, pneumonitis, pneumonia, congestive heart failure, asthma  Brandon Page is a 34 y.o. who presents to the ED with symptoms as described above.  Patient nontoxic-appearing but does have frequent cough and wheezing on exam.  Will give nebs will.  As he was previously on antibiotic will some blood work to ensure is not having any signs of other  underlying metabolic process.  The patient will be placed on continuous pulse oximetry and telemetry for monitoring.  Laboratory evaluation will be sent to evaluate for the above complaints.     Clinical Course as of Oct 21 1444  Sat Oct 21, 2018  1230 Patient reassessed.  Improvement in sx after nebulizer treatment.  Patient will be given steroids as well as a course of azithromycin to treat for atypicals.  No hypoxia on ambulation or at rest.  Has mild tachycardia but is also gotten several nebulizer treatments.  He is otherwise well and nontoxic-appearing I do believe he stable and appropriate for outpatient follow-up.   [PR]    Clinical Course User Index [PR] Willy Eddy, MD     As part of my medical decision making, I reviewed the following data within the electronic MEDICAL RECORD NUMBER Nursing notes reviewed and incorporated, Labs reviewed, notes from prior ED visits and [place] Controlled Substance Database   ____________________________________________   FINAL CLINICAL IMPRESSION(S) / ED DIAGNOSES  Final diagnoses:  Cough  Acute bronchitis, unspecified organism      NEW MEDICATIONS STARTED DURING THIS VISIT:  Discharge Medication List as of 10/21/2018 12:36 PM    START taking these medications   Details  albuterol (PROVENTIL HFA;VENTOLIN HFA) 108 (90 Base) MCG/ACT inhaler Inhale 2 puffs into the lungs every 6 (six) hours as needed for wheezing or shortness of breath., Starting Sat 10/21/2018, Normal    azithromycin (ZITHROMAX) 500 MG tablet Take 1 tablet (500 mg total) by mouth daily for 3 days. Take 1 tablet daily for 3 days., Starting Sat 10/21/2018, Until Tue 10/24/2018, Normal    naproxen (NAPROSYN) 500 MG tablet Take 1 tablet (500 mg total) by mouth 2 (two) times daily with a meal., Starting Sat 10/21/2018, Until Sun 10/21/2019, Normal    predniSONE (DELTASONE) 20 MG tablet Take 2 tablets (40 mg total) by mouth daily for 4 days., Starting Sat 10/21/2018, Until Wed  10/25/2018, Normal         Note:  This document was prepared using Dragon voice recognition software and may include unintentional dictation errors.    Willy Eddy, MD 10/21/18 [phone]

## 2018-10-21 NOTE — ED Triage Notes (Signed)
C/O worsening cough and chills.  States was seen and treated through the ED last week and started on Amoxicillin.  Continues to take Amoxicillin at this time.  AAOx3.  Skin warm and dry. NAD

## 2019-07-17 ENCOUNTER — Emergency Department
Admission: EM | Admit: 2019-07-17 | Discharge: 2019-07-17 | Disposition: A | Discharge status: Home / Self Care | Payer: Self-pay | Attending: Emergency Medicine | Admitting: Emergency Medicine

## 2019-07-17 ENCOUNTER — Other Ambulatory Visit: Payer: Self-pay

## 2019-07-17 ENCOUNTER — Encounter: Payer: Self-pay | Admitting: Emergency Medicine

## 2019-07-17 ENCOUNTER — Emergency Department: Payer: Self-pay

## 2019-07-17 DIAGNOSIS — Y929 Unspecified place or not applicable: Secondary | ICD-10-CM | POA: Insufficient documentation

## 2019-07-17 DIAGNOSIS — G44209 Tension-type headache, unspecified, not intractable: Secondary | ICD-10-CM | POA: Insufficient documentation

## 2019-07-17 DIAGNOSIS — S161XXA Strain of muscle, fascia and tendon at neck level, initial encounter: Secondary | ICD-10-CM | POA: Insufficient documentation

## 2019-07-17 DIAGNOSIS — X58XXXA Exposure to other specified factors, initial encounter: Secondary | ICD-10-CM | POA: Insufficient documentation

## 2019-07-17 DIAGNOSIS — Z79899 Other long term (current) drug therapy: Secondary | ICD-10-CM | POA: Insufficient documentation

## 2019-07-17 DIAGNOSIS — F17210 Nicotine dependence, cigarettes, uncomplicated: Secondary | ICD-10-CM | POA: Insufficient documentation

## 2019-07-17 DIAGNOSIS — Y939 Activity, unspecified: Secondary | ICD-10-CM | POA: Insufficient documentation

## 2019-07-17 DIAGNOSIS — Y999 Unspecified external cause status: Secondary | ICD-10-CM | POA: Insufficient documentation

## 2019-07-17 LAB — CBC WITH DIFFERENTIAL/PLATELET
Abs Immature Granulocytes: 0.03 10*3/uL (ref 0.00–0.07)
Basophils Absolute: 0.1 10*3/uL (ref 0.0–0.1)
Basophils Relative: 1 %
Eosinophils Absolute: 0.1 10*3/uL (ref 0.0–0.5)
Eosinophils Relative: 1 %
HCT: 41.6 % (ref 39.0–52.0)
Hemoglobin: 13.6 g/dL (ref 13.0–17.0)
Immature Granulocytes: 0 %
Lymphocytes Relative: 31 %
Lymphs Abs: 2.7 10*3/uL (ref 0.7–4.0)
MCH: 27.3 pg (ref 26.0–34.0)
MCHC: 32.7 g/dL (ref 30.0–36.0)
MCV: 83.4 fL (ref 80.0–100.0)
Monocytes Absolute: 1.2 10*3/uL — ABNORMAL HIGH (ref 0.1–1.0)
Monocytes Relative: 13 %
Neutro Abs: 4.6 10*3/uL (ref 1.7–7.7)
Neutrophils Relative %: 54 %
Platelets: 298 10*3/uL (ref 150–400)
RBC: 4.99 MIL/uL (ref 4.22–5.81)
RDW: 13.2 % (ref 11.5–15.5)
WBC: 8.7 10*3/uL (ref 4.0–10.5)
nRBC: 0 % (ref 0.0–0.2)

## 2019-07-17 LAB — BASIC METABOLIC PANEL
Anion gap: 10 (ref 5–15)
BUN: 18 mg/dL (ref 6–20)
CO2: 24 mmol/L (ref 22–32)
Calcium: 9 mg/dL (ref 8.9–10.3)
Chloride: 104 mmol/L (ref 98–111)
Creatinine, Ser: 1.03 mg/dL (ref 0.61–1.24)
GFR calc Af Amer: >60 mL/min (ref >60)
GFR calc non Af Amer: >60 mL/min (ref >60)
Glucose, Bld: 95 mg/dL (ref 70–99)
Potassium: 3.9 mmol/L (ref 3.5–5.1)
Sodium: 138 mmol/L (ref 135–145)

## 2019-07-17 MED ORDER — IOHEXOL 350 MG/ML SOLN
75.0000 mL | Freq: Once | INTRAVENOUS | Status: AC | PRN
  Administered 2019-07-17: 16:00:00 75 mL via INTRAVENOUS
  Filled 2019-07-17: qty 75

## 2019-07-17 MED ORDER — DIPHENHYDRAMINE HCL 50 MG/ML IJ SOLN
50.0000 mg | Freq: Once | INTRAMUSCULAR | Status: AC
  Administered 2019-07-17: 16:00:00 50 mg via INTRAVENOUS
  Filled 2019-07-17: qty 1

## 2019-07-17 MED ORDER — KETOROLAC TROMETHAMINE 10 MG PO TABS: 10.0000 mg | ORAL_TABLET | Freq: Four times a day (QID) | ORAL | 0 refills | Status: DC | PRN

## 2019-07-17 MED ORDER — KETOROLAC TROMETHAMINE 30 MG/ML IJ SOLN
15.0000 mg | INTRAMUSCULAR | Status: AC
  Administered 2019-07-17: 16:00:00 15 mg via INTRAVENOUS
  Filled 2019-07-17: qty 1

## 2019-07-17 MED ORDER — METOCLOPRAMIDE HCL 5 MG/ML IJ SOLN
10.0000 mg | Freq: Once | INTRAMUSCULAR | Status: AC
  Administered 2019-07-17: 16:00:00 10 mg via INTRAVENOUS
  Filled 2019-07-17: qty 2

## 2019-07-17 MED ORDER — DIAZEPAM 5 MG PO TABS: 5.0000 mg | ORAL_TABLET | Freq: Every evening | ORAL | 0 refills | Status: AC | PRN

## 2019-07-17 MED ORDER — SODIUM CHLORIDE 0.9 % IV BOLUS
1000.0000 mL | Freq: Once | INTRAVENOUS | Status: AC
  Administered 2019-07-17: 1000 mL via INTRAVENOUS

## 2019-07-17 NOTE — ED Provider Notes (Signed)
[facility] Emergency Department Provider Note  ____________________________________________  Time seen: Approximately 4:15 PM  I have reviewed the triage vital signs and the nursing notes.   HISTORY  Chief Complaint Headache    HPI Brandon Page is a 33 y.o. male with no significant past medical history who comes the ED complaining of a constant headache for the past week.  Onset was gradual, worse with exertion and turning his head.  Waxing and waning.  No alleviating factors.  Hurts in the left neck radiating up into the left temporal area.  No vision changes paresthesias or weakness.  Denies any fevers chills body aches sweats sick contacts.  No fever or neck stiffness.  No recent trauma.      History reviewed. No pertinent past medical history.   There are no active problems to display for this patient.    Past Surgical History:  Procedure Laterality Date  . ANKLE SURGERY  right  . LEG SURGERY  left     Prior to Admission medications   Medication Sig Start Date End Date Taking? Authorizing Provider  albuterol (PROVENTIL HFA;VENTOLIN HFA) 108 (90 Base) MCG/ACT inhaler Inhale 2 puffs into the lungs every 6 (six) hours as needed for wheezing or shortness of breath. 10/21/18   Willy Eddy, MD  amoxicillin (AMOXIL) 500 MG capsule Take 1 capsule (500 mg total) by mouth 3 (three) times daily. 10/16/18   Triplett, Kasandra Knudsen, FNP  aspirin-acetaminophen-caffeine (EXCEDRIN MIGRAINE) [phone] MG per tablet Take 2 tablets by mouth every 6 (six) hours as needed. For pain    [provider]  Chlorphen-Pseudoephed-APAP (THERAFLU FLU/COLD PO) Take 1 Package by mouth daily as needed. For cold symptoms    [provider]  clotrimazole-betamethasone (LOTRISONE) cream Apply to affected area 2 times daily 03/23/17   Joni Reining, PA-C  diazepam (VALIUM) 5 MG tablet Take 1 tablet (5 mg total) by mouth at bedtime as needed for up to 5 days for  muscle spasms. 07/17/19 07/22/19  Sharman Cheek, MD  guaiFENesin-codeine 100-10 MG/5ML syrup Take 10 mLs by mouth 3 (three) times daily as needed. 10/16/18   Kem Boroughs B, FNP  HYDROcodone-acetaminophen (NORCO/VICODIN) 5-325 MG per tablet 1 or 2 po q4h prn pain 08/29/12   Ivery Quale, PA-C  hydrOXYzine (ATARAX/VISTARIL) 50 MG tablet Take 1 tablet (50 mg total) by mouth 3 (three) times daily as needed. 03/23/17   Joni Reining, PA-C  ketorolac (TORADOL) 10 MG tablet Take 1 tablet (10 mg total) by mouth every 6 (six) hours as needed for moderate pain. 07/17/19   Sharman Cheek, MD  naproxen (NAPROSYN) 500 MG tablet Take 1 tablet (500 mg total) by mouth 2 (two) times daily with a meal. 10/21/18 10/21/19  Willy Eddy, MD  promethazine (PHENERGAN) 25 MG tablet Take 1 tablet (25 mg total) by mouth every 6 (six) hours as needed for nausea. 05/12/12 05/19/12  Donnetta Hutching, MD  sulfamethoxazole-trimethoprim (SEPTRA DS) 800-160 MG per tablet Take 1 tablet by mouth every 12 (twelve) hours. 08/29/12   Ivery Quale, PA-C     Allergies Patient has no known allergies.   No family history on file.  Social History Social History   Tobacco Use  . Smoking status: Current Every Day Smoker    Types: Cigarettes  . Smokeless tobacco: Never Used  Substance Use Topics  . Alcohol use: Yes    Comment: occasional  . Drug use: Not Currently    Types: Marijuana    Review  of Systems  Constitutional:   No fever or chills.  ENT:   No sore throat. No rhinorrhea. Cardiovascular:   No chest pain or syncope. Respiratory:   No dyspnea or cough. Gastrointestinal:   Negative for abdominal pain, vomiting and diarrhea.  Musculoskeletal:   Left neck pain as above All other systems reviewed and are negative except as documented above in ROS and HPI.  ____________________________________________   PHYSICAL EXAM:  VITAL SIGNS: ED Triage Vitals  Enc Vitals Group     BP 07/17/19 1328 (!) 155/77      Pulse Rate 07/17/19 1328 87     Resp 07/17/19 1328 16     Temp 07/17/19 1328 98.7 F (37.1 C)     Temp Source 07/17/19 1328 Oral     SpO2 07/17/19 1328 98 %     Weight --      Height --      Head Circumference --      Peak Flow --      Pain Score 07/17/19 1326 7     Pain Loc --      Pain Edu? --      Excl. in [place]? --     Vital signs reviewed, nursing assessments reviewed.   Constitutional:   Alert and oriented. Non-toxic appearance. Eyes:   Conjunctivae are normal. EOMI. PERRL. ENT      Head:   Normocephalic and atraumatic.  No temporal artery tenderness      Nose:   Wearing a mask.      Mouth/Throat:   Wearing a mask.      Neck:   No meningismus. Full ROM.  No bruit.  Sternocleidomastoid tender.  Pain reproduced by turning the head to the right against resistance stressing the left SCM. Hematological/Lymphatic/Immunilogical:   No cervical lymphadenopathy. Cardiovascular:   RRR. Symmetric bilateral radial and DP pulses.  No murmurs. Cap refill less than 2 seconds. Respiratory:   Normal respiratory effort without tachypnea/retractions. Breath sounds are clear and equal bilaterally. No wheezes/rales/rhonchi. Gastrointestinal:   Soft and nontender. Non distended. There is no CVA tenderness.  No rebound, rigidity, or guarding. Musculoskeletal:   Normal range of motion in all extremities. No joint effusions.  No lower extremity tenderness.  No edema. Neurologic:   Normal speech and language.  Motor grossly intact. No acute focal neurologic deficits are appreciated.  Skin:    Skin is warm, dry and intact. No rash noted.  No petechiae, purpura, or bullae.  ____________________________________________    LABS (pertinent positives/negatives) (all labs ordered are listed, but only abnormal results are displayed) Labs Reviewed  CBC WITH DIFFERENTIAL/PLATELET - Abnormal; Notable for the following components:      Result Value   Monocytes Absolute 1.2 (*)    All other components  within normal limits  BASIC METABOLIC PANEL   ____________________________________________   EKG    ____________________________________________    RADIOLOGY  Ct Angio Head W Or Wo Contrast  Result Date: 07/17/2019 CLINICAL DATA:  Headache 1 week.  Rule out cerebral aneurysm EXAM: CT ANGIOGRAPHY HEAD AND NECK TECHNIQUE: Multidetector CT imaging of the head and neck was performed using the standard protocol during bolus administration of intravenous contrast. Multiplanar CT image reconstructions and MIPs were obtained to evaluate the vascular anatomy. Carotid stenosis measurements (when applicable) are obtained utilizing NASCET criteria, using the distal internal carotid diameter as the denominator. CONTRAST:  60mL OMNIPAQUE IOHEXOL 350 MG/ML SOLN COMPARISON:  None. FINDINGS: CT HEAD FINDINGS Brain: No evidence of acute infarction,  hemorrhage, hydrocephalus, extra-axial collection or mass lesion/mass effect. Vascular: Negative for hyperdense vessel Skull: Negative Sinuses: Negative Orbits: Negative Review of the MIP images confirms the above findings CTA NECK FINDINGS Aortic arch: Standard branching. Imaged portion shows no evidence of aneurysm or dissection. No significant stenosis of the major arch vessel origins. Right carotid system: Normal right carotid system. Negative for stenosis or dissection Left carotid system: Normal left carotid system. Negative for stenosis or dissection Vertebral arteries: Page vertebral arteries normal. Skeleton: Negative Other neck: Normal soft tissues of the neck.  No mass or adenopathy. Upper chest: Lung apices clear bilaterally. Review of the MIP images confirms the above findings CTA HEAD FINDINGS Anterior circulation: Cavernous carotid normal bilaterally without stenosis or aneurysm. Anterior and middle cerebral arteries normal bilaterally without stenosis. Negative for aneurysm or vascular malformation. Posterior circulation: Page vertebral arteries patent to  the basilar. PICA patent bilaterally. Basilar patent. Superior cerebellar and posterior cerebral arteries patent bilaterally without stenosis. Negative for aneurysm or vascular malformation in the posterior circulation. Venous sinuses: Normal venous enhancement Anatomic variants: None Review of the MIP images confirms the above findings IMPRESSION: Negative CT head Negative CTA head and neck Negative for aneurysm or vascular malformation. Electronically Signed   By: Marlan Palauharles  Clark M.D.   On: 07/17/2019 16:31   Ct Angio Neck W And/or Wo Contrast  Result Date: 07/17/2019 CLINICAL DATA:  Headache 1 week.  Rule out cerebral aneurysm EXAM: CT ANGIOGRAPHY HEAD AND NECK TECHNIQUE: Multidetector CT imaging of the head and neck was performed using the standard protocol during bolus administration of intravenous contrast. Multiplanar CT image reconstructions and MIPs were obtained to evaluate the vascular anatomy. Carotid stenosis measurements (when applicable) are obtained utilizing NASCET criteria, using the distal internal carotid diameter as the denominator. CONTRAST:  75mL OMNIPAQUE IOHEXOL 350 MG/ML SOLN COMPARISON:  None. FINDINGS: CT HEAD FINDINGS Brain: No evidence of acute infarction, hemorrhage, hydrocephalus, extra-axial collection or mass lesion/mass effect. Vascular: Negative for hyperdense vessel Skull: Negative Sinuses: Negative Orbits: Negative Review of the MIP images confirms the above findings CTA NECK FINDINGS Aortic arch: Standard branching. Imaged portion shows no evidence of aneurysm or dissection. No significant stenosis of the major arch vessel origins. Right carotid system: Normal right carotid system. Negative for stenosis or dissection Left carotid system: Normal left carotid system. Negative for stenosis or dissection Vertebral arteries: Page vertebral arteries normal. Skeleton: Negative Other neck: Normal soft tissues of the neck.  No mass or adenopathy. Upper chest: Lung apices clear  bilaterally. Review of the MIP images confirms the above findings CTA HEAD FINDINGS Anterior circulation: Cavernous carotid normal bilaterally without stenosis or aneurysm. Anterior and middle cerebral arteries normal bilaterally without stenosis. Negative for aneurysm or vascular malformation. Posterior circulation: Page vertebral arteries patent to the basilar. PICA patent bilaterally. Basilar patent. Superior cerebellar and posterior cerebral arteries patent bilaterally without stenosis. Negative for aneurysm or vascular malformation in the posterior circulation. Venous sinuses: Normal venous enhancement Anatomic variants: None Review of the MIP images confirms the above findings IMPRESSION: Negative CT head Negative CTA head and neck Negative for aneurysm or vascular malformation. Electronically Signed   By: Marlan Palauharles  Clark M.D.   On: 07/17/2019 16:31    ____________________________________________   PROCEDURES Procedures  ____________________________________________  DIFFERENTIAL DIAGNOSIS   Torticollis/sternocleidomastoid strain, carotid artery dissection  CLINICAL IMPRESSION / ASSESSMENT AND PLAN / ED COURSE  Medications ordered in the ED: Medications  sodium chloride 0.9 % bolus 1,000 mL (1,000 mLs Intravenous New Bag/Given 07/17/19 1530)  metoCLOPramide (REGLAN) injection 10 mg (10 mg Intravenous Given 07/17/19 1533)  diphenhydrAMINE (BENADRYL) injection 50 mg (50 mg Intravenous Given 07/17/19 1533)  ketorolac (TORADOL) 30 MG/ML injection 15 mg (15 mg Intravenous Given 07/17/19 1533)  iohexol (OMNIPAQUE) 350 MG/ML injection 75 mL (75 mLs Intravenous Contrast Given 07/17/19 1600)    Pertinent labs & imaging results that were available during my care of the patient were reviewed by me and considered in my medical decision making (see chart for details).  KYRE JEFFRIES was evaluated in Emergency Department on 07/17/2019 for the symptoms described in the history of present illness.  He was evaluated in the context of the global COVID-19 pandemic, which necessitated consideration that the patient might be at risk for infection with the SARS-CoV-2 virus that causes COVID-19. Institutional protocols and algorithms that pertain to the evaluation of patients at risk for COVID-19 are in a state of rapid change based on information released by regulatory bodies including the CDC and federal and state organizations. These policies and algorithms were followed during the patient's care in the ED.   Patient presents with left neck pain, waxing and waning and intermittently severe.  Not thunderclap in onset.  Unlikely to be vascular, but patient is worried about the possibility of dissection, and in shared decision making we agreed to go ahead and obtain CT imaging now to rule this out so that he can proceed with confidence treating this as a musculoskeletal pain with heat therapy and NSAIDs.  Clinical Course as of Jul 17 1647  Tue Jul 17, 2019  1645 CTA negative.  Stable for discharge home.   [PS]    Clinical Course User Index [PS] Sharman Cheek, MD     ____________________________________________   FINAL CLINICAL IMPRESSION(S) / ED DIAGNOSES    Final diagnoses:  Tension headache  Strain of sternocleidomastoid muscle, initial encounter     ED Discharge Orders         Ordered    ketorolac (TORADOL) 10 MG tablet  Every 6 hours PRN     07/17/19 1647    diazepam (VALIUM) 5 MG tablet  At bedtime PRN     07/17/19 1647          Portions of this note were generated with dragon dictation software. Dictation errors may occur despite best attempts at proofreading.   Sharman Cheek, MD 07/17/19 [id number]

## 2019-07-17 NOTE — ED Triage Notes (Signed)
PT c/o headache x1wk radiating into neck. Denies any body aches or fever.

## 2019-07-17 NOTE — Discharge Instructions (Addendum)
Your lab tests and CT scans were normal today. Use anti-inflammatory medicine during the day, diazepam at night, and heat therapy off and on to help manage the symptoms.

## 2020-01-31 IMAGING — CR DG CHEST 2V
2 series · 2 of 2 positions shown · non-contrast
Comparison: 07/29/2011

CLINICAL DATA: Cough and chills for 1 week.  Ex-smoker.

EXAM:
CHEST - 2 VIEW

[chest pa]
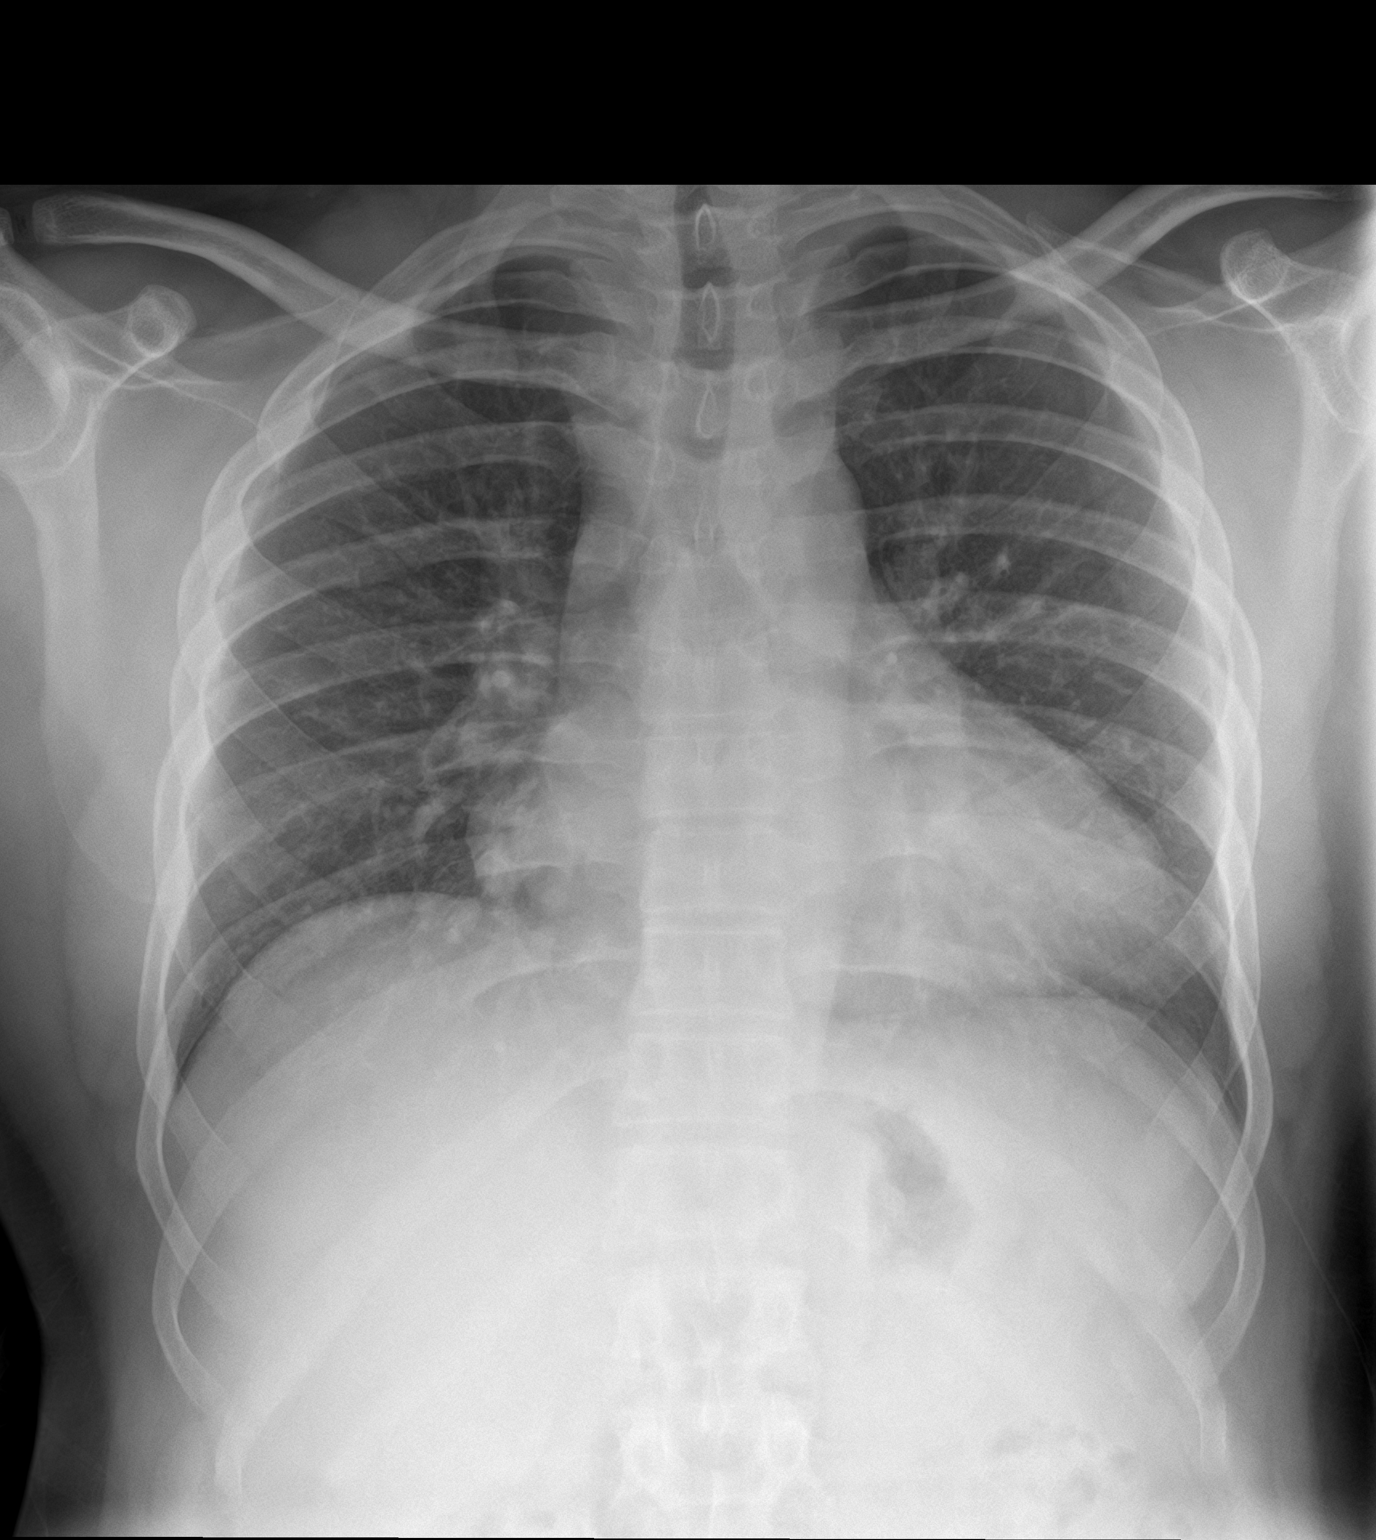

[chest lat]
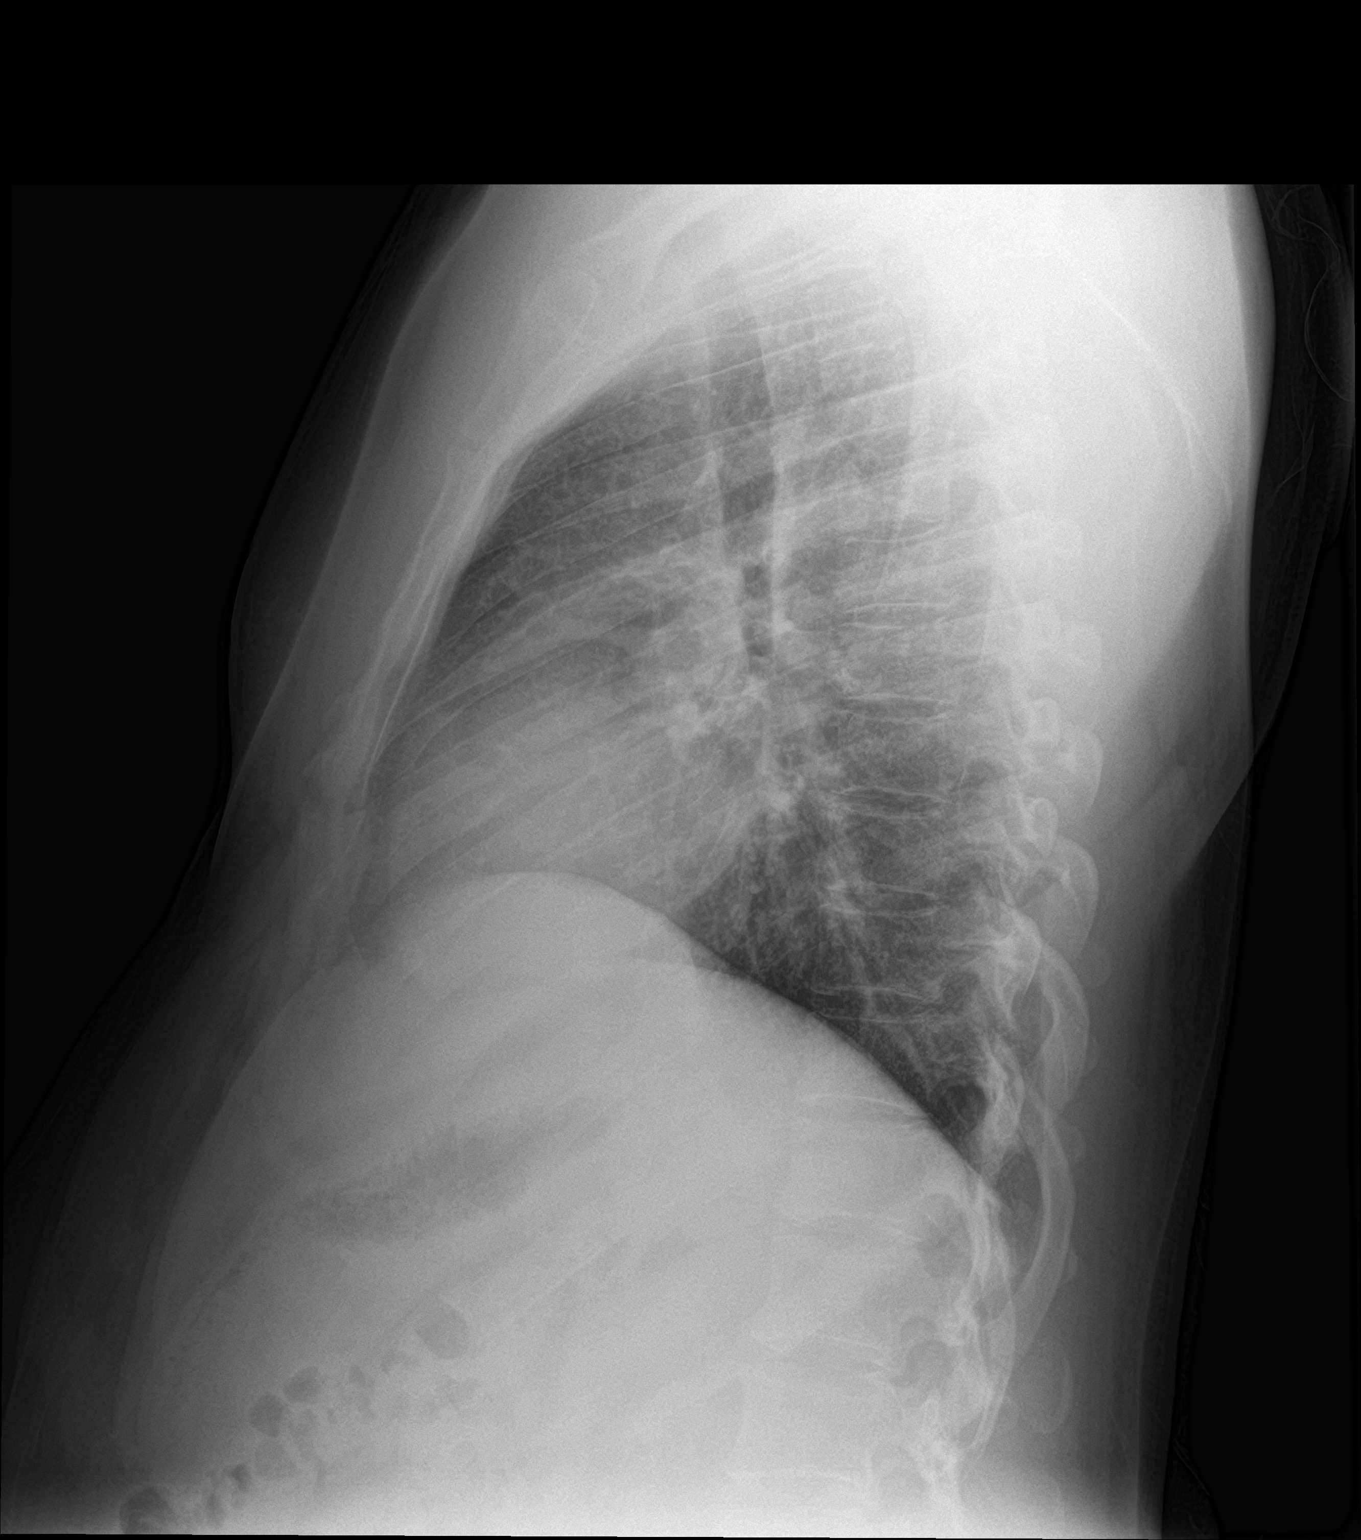

[2 of 2 positions shown; findings below may reference images not displayed]

FINDINGS: Midline trachea. Borderline cardiomegaly. Mediastinal contours
otherwise within normal limits. No pleural effusion or pneumothorax.
No lobar consolidation.
IMPRESSION: Borderline cardiomegaly, without acute disease.

## 2020-05-29 ENCOUNTER — Encounter: Payer: Self-pay | Admitting: Physician Assistant

## 2020-05-29 ENCOUNTER — Ambulatory Visit: Payer: Self-pay | Admitting: Physician Assistant

## 2020-05-29 ENCOUNTER — Other Ambulatory Visit: Payer: Self-pay

## 2020-05-29 DIAGNOSIS — N341 Nonspecific urethritis: Secondary | ICD-10-CM

## 2020-05-29 DIAGNOSIS — Z113 Encounter for screening for infections with a predominantly sexual mode of transmission: Secondary | ICD-10-CM

## 2020-05-29 LAB — GRAM STAIN

## 2020-05-29 MED ORDER — DOXYCYCLINE HYCLATE 100 MG PO TABS: 100.0000 mg | ORAL_TABLET | Freq: Two times a day (BID) | ORAL | 0 refills | Status: AC

## 2020-05-29 NOTE — Progress Notes (Signed)
   [facility] Department STI clinic/screening visit  Subjective:  Brandon Page is a 34 y.o. male being seen today for an STI screening visit. The patient reports they do have symptoms.    Patient has the following medical conditions:  There are no problems to display for this patient.    Chief Complaint  Patient presents with  . SEXUALLY TRANSMITTED DISEASE    screening    HPI  Patient reports that he has had yellow/green discharge for 4-5 days.  States that it looks like "pus".  Denies other symptoms, chronic conditions and regular medicines.  States last HIV test was in 2020.   See flowsheet for further details and programmatic requirements.    The following portions of the patient's history were reviewed and updated as appropriate: allergies, current medications, past medical history, past social history, past surgical history and problem list.  Objective:  There were no vitals filed for this visit.  Physical Exam Constitutional:      General: He is not in acute distress.    Appearance: Normal appearance.  HENT:     Head: Normocephalic and atraumatic.     Comments: No nits,lice, or hair loss. No cervical, supraclavicular or axillary adenopathy.    Mouth/Throat:     Mouth: Mucous membranes are moist.     Pharynx: Oropharynx is clear. No oropharyngeal exudate or posterior oropharyngeal erythema.  Eyes:     Conjunctiva/sclera: Conjunctivae normal.  Pulmonary:     Effort: Pulmonary effort is normal.  Abdominal:     Palpations: Abdomen is soft. There is no mass.     Tenderness: There is no abdominal tenderness. There is no guarding or rebound.  Genitourinary:    Penis: Normal.      Testes: Normal.     Comments: Pubic area without nits, lice, hair loss, edema, erythema, lesions and inguinal adenopathy. Penis uncircumcised, without rash or  Lesions. Small amount of clear/grayish discharge at meatus. Musculoskeletal:     Cervical back: Neck supple. No  tenderness.  Skin:    General: Skin is warm and dry.     Findings: No bruising, erythema, lesion or rash.  Neurological:     Mental Status: He is alert and oriented to person, place, and time.  Psychiatric:        Mood and Affect: Mood normal.        Behavior: Behavior normal.        Thought Content: Thought content normal.        Judgment: Judgment normal.       Assessment and Plan:  Brandon Page is a 34 y.o. male presenting to the [facility] Department for STI screening  1. Screening for STD (sexually transmitted disease) Patient into clinic with symptoms. Rec condoms with all sex. Await test results.  Counseled that RN will call if needs to RTC for treatment once results are back. - Gram stain - Gonococcus culture - HIV [facility] - Syphilis Serology, [facility]  2. NGU (nongonococcal urethritis) Will treat for NGU due to exam findings with Doxycycline 100 mg #14 1 po BID for 7 days. No sex for 10 days and until after partner completes treatment. - doxycycline (VIBRA-TABS) 100 MG tablet; Take 1 tablet (100 mg total) by mouth 2 (two) times daily for 7 days.  Dispense: 14 tablet; Refill: 0     No follow-ups on file.  No future appointments.  Matt Holmes, PA

## 2020-05-29 NOTE — Progress Notes (Signed)
Gram stain reviewed with provider.  Tx'd with Doxy x 7 days.Richmond Campbell, RN

## 2020-06-03 LAB — GONOCOCCUS CULTURE

## 2021-07-06 ENCOUNTER — Emergency Department: Payer: Self-pay

## 2021-07-06 ENCOUNTER — Other Ambulatory Visit: Payer: Self-pay

## 2021-07-06 ENCOUNTER — Emergency Department
Admission: EM | Admit: 2021-07-06 | Discharge: 2021-07-06 | Disposition: A | Discharge status: Home / Self Care | Payer: Self-pay | Attending: Emergency Medicine | Admitting: Emergency Medicine

## 2021-07-06 DIAGNOSIS — F17210 Nicotine dependence, cigarettes, uncomplicated: Secondary | ICD-10-CM | POA: Insufficient documentation

## 2021-07-06 DIAGNOSIS — S161XXA Strain of muscle, fascia and tendon at neck level, initial encounter: Secondary | ICD-10-CM | POA: Insufficient documentation

## 2021-07-06 DIAGNOSIS — R519 Headache, unspecified: Secondary | ICD-10-CM | POA: Insufficient documentation

## 2021-07-06 DIAGNOSIS — X58XXXA Exposure to other specified factors, initial encounter: Secondary | ICD-10-CM | POA: Insufficient documentation

## 2021-07-06 LAB — CBC
HCT: 42.1 % (ref 39.0–52.0)
Hemoglobin: 13.8 g/dL (ref 13.0–17.0)
MCH: 27.8 pg (ref 26.0–34.0)
MCHC: 32.8 g/dL (ref 30.0–36.0)
MCV: 84.7 fL (ref 80.0–100.0)
Platelets: 274 10*3/uL (ref 150–400)
RBC: 4.97 MIL/uL (ref 4.22–5.81)
RDW: 13.2 % (ref 11.5–15.5)
WBC: 7.9 10*3/uL (ref 4.0–10.5)
nRBC: 0 % (ref 0.0–0.2)

## 2021-07-06 LAB — BASIC METABOLIC PANEL
Anion gap: 9 (ref 5–15)
BUN: 18 mg/dL (ref 6–20)
CO2: 23 mmol/L (ref 22–32)
Calcium: 8.6 mg/dL — ABNORMAL LOW (ref 8.9–10.3)
Chloride: 106 mmol/L (ref 98–111)
Creatinine, Ser: 0.95 mg/dL (ref 0.61–1.24)
GFR, Estimated: >60 mL/min (ref >60)
Glucose, Bld: 101 mg/dL — ABNORMAL HIGH (ref 70–99)
Potassium: 3.7 mmol/L (ref 3.5–5.1)
Sodium: 138 mmol/L (ref 135–145)

## 2021-07-06 MED ORDER — SODIUM CHLORIDE 0.9 % IV BOLUS
1000.0000 mL | Freq: Once | INTRAVENOUS | Status: AC
  Administered 2021-07-06: 1000 mL via INTRAVENOUS

## 2021-07-06 MED ORDER — ONDANSETRON HCL 4 MG/2ML IJ SOLN
4.0000 mg | Freq: Once | INTRAMUSCULAR | Status: AC
  Administered 2021-07-06: 4 mg via INTRAVENOUS
  Filled 2021-07-06: qty 2

## 2021-07-06 MED ORDER — IOHEXOL 350 MG/ML SOLN
75.0000 mL | Freq: Once | INTRAVENOUS | Status: AC | PRN
  Administered 2021-07-06: 75 mL via INTRAVENOUS

## 2021-07-06 MED ORDER — DIPHENHYDRAMINE HCL 50 MG/ML IJ SOLN
25.0000 mg | Freq: Once | INTRAMUSCULAR | Status: AC
  Administered 2021-07-06: 25 mg via INTRAVENOUS
  Filled 2021-07-06: qty 1

## 2021-07-06 MED ORDER — PROCHLORPERAZINE EDISYLATE 10 MG/2ML IJ SOLN
10.0000 mg | Freq: Once | INTRAMUSCULAR | Status: AC
  Administered 2021-07-06: 10 mg via INTRAVENOUS
  Filled 2021-07-06: qty 2

## 2021-07-06 MED ORDER — METOCLOPRAMIDE HCL 10 MG PO TABS: 10.0000 mg | ORAL_TABLET | Freq: Four times a day (QID) | ORAL | 0 refills | Status: DC | PRN

## 2021-07-06 MED ORDER — KETOROLAC TROMETHAMINE 30 MG/ML IJ SOLN
15.0000 mg | INTRAMUSCULAR | Status: AC
  Administered 2021-07-06: 15 mg via INTRAVENOUS
  Filled 2021-07-06: qty 1

## 2021-07-06 MED ORDER — FAMOTIDINE 20 MG PO TABS: 20.0000 mg | ORAL_TABLET | Freq: Two times a day (BID) | ORAL | 0 refills | Status: DC

## 2021-07-06 MED ORDER — NAPROXEN 500 MG PO TABS: 500.0000 mg | ORAL_TABLET | Freq: Two times a day (BID) | ORAL | 0 refills | Status: DC

## 2021-07-06 NOTE — ED Triage Notes (Signed)
Pt to ED for headache x5 days radiating from neck. Denies hx of migraine. States started hurting after he sneezed. Has tried OTC meds.  Has not been dx with HTN

## 2021-07-06 NOTE — ED Provider Notes (Signed)
[facility] Emergency Department Provider Note  ____________________________________________  Time seen: Approximately 2:22 PM  I have reviewed the triage vital signs and the nursing notes.   HISTORY  Chief Complaint Headache    HPI Brandon Page is a 35 y.o. male who comes ED with a sudden onset of a severe left-sided headache that started after sneezing 5 days ago.  No vision changes paresthesias or motor weakness, but he has had constant severe headache which seems to be worsening over the last 5 days.  Pain originates in the left neck and radiates upward.  Worse with movement, no alleviating factors.  No fever.  No neck stiffness.    History reviewed. No pertinent past medical history.   There are no problems to display for this patient.    Past Surgical History:  Procedure Laterality Date   ANKLE SURGERY  right   LEG SURGERY  left     Prior to Admission medications   Medication Sig Start Date End Date Taking? Authorizing Provider  famotidine (PEPCID) 20 MG tablet Take 1 tablet (20 mg total) by mouth 2 (two) times daily. 07/06/21  Yes Sharman Cheek, MD  metoCLOPramide (REGLAN) 10 MG tablet Take 1 tablet (10 mg total) by mouth every 6 (six) hours as needed. 07/06/21  Yes Sharman Cheek, MD  naproxen (NAPROSYN) 500 MG tablet Take 1 tablet (500 mg total) by mouth 2 (two) times daily with a meal. 07/06/21  Yes Sharman Cheek, MD  ranitidine (ZANTAC) 75 MG tablet Take 75 mg by mouth once as needed for heartburn.   Yes [provider]  albuterol (PROVENTIL HFA;VENTOLIN HFA) 108 (90 Base) MCG/ACT inhaler Inhale 2 puffs into the lungs every 6 (six) hours as needed for wheezing or shortness of breath. 10/21/18   Willy Eddy, MD  amoxicillin (AMOXIL) 500 MG capsule Take 1 capsule (500 mg total) by mouth 3 (three) times daily. Patient not taking: No sig reported 10/16/18   Kem Boroughs B, FNP  aspirin-acetaminophen-caffeine (EXCEDRIN  MIGRAINE) 416 280 3221 MG per tablet Take 2 tablets by mouth every 6 (six) hours as needed. For pain    [provider]  Chlorphen-Pseudoephed-APAP (THERAFLU FLU/COLD PO) Take 1 Package by mouth daily as needed. For cold symptoms    [provider]  clotrimazole-betamethasone (LOTRISONE) cream Apply to affected area 2 times daily 03/23/17   Joni Reining, PA-C  hydrOXYzine (ATARAX/VISTARIL) 50 MG tablet Take 1 tablet (50 mg total) by mouth 3 (three) times daily as needed. Patient not taking: No sig reported 03/23/17   Joni Reining, PA-C  ketorolac (TORADOL) 10 MG tablet Take 1 tablet (10 mg total) by mouth every 6 (six) hours as needed for moderate pain. 07/17/19   Sharman Cheek, MD  promethazine (PHENERGAN) 25 MG tablet Take 1 tablet (25 mg total) by mouth every 6 (six) hours as needed for nausea. 05/12/12 05/19/12  Donnetta Hutching, MD     Allergies Patient has no known allergies.   No family history on file.  Social History Social History   Tobacco Use   Smoking status: Every Day    Types: Cigarettes   Smokeless tobacco: Never  Vaping Use   Vaping Use: Never used  Substance Use Topics   Alcohol use: Yes    Comment: occasional   Drug use: Not Currently    Types: Marijuana    Review of Systems  Constitutional:   No fever or chills.  ENT:   No sore throat. No rhinorrhea. Cardiovascular:  No chest pain or syncope. Respiratory:   No dyspnea or cough. Gastrointestinal:   Negative for abdominal pain, vomiting and diarrhea.  Musculoskeletal:   Positive neck pain and headache as above All other systems reviewed and are negative except as documented above in ROS and HPI.  ____________________________________________   PHYSICAL EXAM:  VITAL SIGNS: ED Triage Vitals [07/06/21 0934]  Enc Vitals Group     BP (!) 158/109     Pulse Rate 86     Resp 18     Temp 98.2 F (36.8 C)     Temp Source Oral     SpO2 97 %     Weight 200 lb (90.7 kg)     Height 5\' 6"   (1.676 m)     Head Circumference      Peak Flow      Pain Score 8     Pain Loc      Pain Edu?      Excl. in GC?     Vital signs reviewed, nursing assessments reviewed.   Constitutional:   Alert and oriented. Non-toxic appearance. Eyes:   Conjunctivae are normal. EOMI. PERRL. ENT      Head:   Normocephalic and atraumatic.      Nose:   Wearing a mask.      Mouth/Throat:   Wearing a mask.      Neck:   No meningismus. Full ROM.  There is tenderness in the left neck musculature.  No pulsatile mass or asymmetry. Hematological/Lymphatic/Immunilogical:   No cervical lymphadenopathy. Cardiovascular:   RRR. Symmetric bilateral radial and DP pulses.  No murmurs. Cap refill less than 2 seconds. Respiratory:   Normal respiratory effort without tachypnea/retractions. Breath sounds are clear and equal bilaterally. No wheezes/rales/rhonchi. Gastrointestinal:   Soft and nontender. Non distended. There is no CVA tenderness.  No rebound, rigidity, or guarding. Genitourinary:   deferred Musculoskeletal:   Normal range of motion in all extremities. No joint effusions.  No lower extremity tenderness.  No edema. Neurologic:   Normal speech and language.  Motor grossly intact. No acute focal neurologic deficits are appreciated.  Skin:    Skin is warm, dry and intact. No rash noted.  No petechiae, purpura, or bullae.  ____________________________________________    LABS (pertinent positives/negatives) (all labs ordered are listed, but only abnormal results are displayed) Labs Reviewed  BASIC METABOLIC PANEL - Abnormal; Notable for the following components:      Result Value   Glucose, Bld 101 (*)    Calcium 8.6 (*)    All other components within normal limits  CBC   ____________________________________________   EKG    ____________________________________________    RADIOLOGY  CT ANGIO HEAD NECK W WO CM  Result Date: 07/06/2021 CLINICAL DATA:  Headache and neck pain. Vertebral artery  dissection suspected EXAM: CT ANGIOGRAPHY HEAD AND NECK TECHNIQUE: Multidetector CT imaging of the head and neck was performed using the standard protocol during bolus administration of intravenous contrast. Multiplanar CT image reconstructions and MIPs were obtained to evaluate the vascular anatomy. Carotid stenosis measurements (when applicable) are obtained utilizing NASCET criteria, using the distal internal carotid diameter as the denominator. CONTRAST:  19mL OMNIPAQUE IOHEXOL 350 MG/ML SOLN COMPARISON:  CT angiography 07/17/2019 FINDINGS: CT HEAD FINDINGS Brain: The brain shows a normal appearance without evidence of malformation, atrophy, old or acute small or large vessel infarction, mass lesion, hemorrhage, hydrocephalus or extra-axial collection. After contrast administration, no abnormal enhancement occurs. Vascular: No hyperdense vessel. No evidence of  atherosclerotic calcification. Skull: Normal.  No traumatic finding.  No focal bone lesion. Sinuses/Orbits: Sinuses are clear. Orbits appear normal. Mastoids are clear. Other: None significant CTA NECK FINDINGS Aortic arch: Normal Right carotid system: Normal. No atherosclerotic disease, stenosis or dissection. Left carotid system: Normal. No atherosclerotic disease, stenosis or dissection Vertebral arteries: Both vertebral artery origins are widely patent. Both vertebral arteries appear normal through the cervical region to the foramen magnum. Skeleton: Normal Other neck: No mass or lymphadenopathy. Upper chest: Normal Review of the MIP images confirms the above findings CTA HEAD FINDINGS Anterior circulation: Both internal carotid arteries are widely patent through the skull base and siphon regions. The anterior and middle cerebral vessels are normal. No evidence of branch vessel occlusion, aneurysm or vascular malformation. Posterior circulation: Both vertebral arteries are widely patent to the basilar. No basilar stenosis. Posterior circulation branch  vessels are normal. Venous sinuses: Patent and normal. Incidental arachnoid granulation in the left transverse sinus. Anatomic variants: None Review of the MIP images confirms the above findings IMPRESSION: Normal head CT. Normal CT angiography of the head and neck. No evidence of vascular dissection or other cause of neck pain or headache is identified. Electronically Signed   By: Paulina Fusi M.D.   On: 07/06/2021 13:33    ____________________________________________   PROCEDURES Procedures  ____________________________________________  DIFFERENTIAL DIAGNOSIS   Intracranial hemorrhage, vertebral artery dissection, muscle strain, tension headache, migraine headache  CLINICAL IMPRESSION / ASSESSMENT AND PLAN / ED COURSE  Medications ordered in the ED: Medications  prochlorperazine (COMPAZINE) injection 10 mg (10 mg Intravenous Given 07/06/21 1259)  diphenhydrAMINE (BENADRYL) injection 25 mg (25 mg Intravenous Given 07/06/21 1258)  ondansetron (ZOFRAN) injection 4 mg (4 mg Intravenous Given 07/06/21 1259)  sodium chloride 0.9 % bolus 1,000 mL (1,000 mLs Intravenous New Bag/Given 07/06/21 1259)  iohexol (OMNIPAQUE) 350 MG/ML injection 75 mL (75 mLs Intravenous Contrast Given 07/06/21 1314)  ketorolac (TORADOL) 30 MG/ML injection 15 mg (15 mg Intravenous Given 07/06/21 1417)    Pertinent labs & imaging results that were available during my care of the patient were reviewed by me and considered in my medical decision making (see chart for details).  Brandon Page was evaluated in Emergency Department on 07/06/2021 for the symptoms described in the history of present illness. He was evaluated in the context of the global COVID-19 pandemic, which necessitated consideration that the patient might be at risk for infection with the SARS-CoV-2 virus that causes COVID-19. Institutional protocols and algorithms that pertain to the evaluation of patients at risk for COVID-19 are in a state of rapid change  based on information released by regulatory bodies including the CDC and federal and state organizations. These policies and algorithms were followed during the patient's care in the ED.   Patient presents with severe headache, sudden onset after a sneeze.  Vital signs are unremarkable.  CT scan obtained which is unremarkable.  Clinically he appears to have a muscular strain from the sudden sneezing movement, which I suspect is causing a tension headache.  After receiving Benadryl and Compazine in the ED he is feeling better.  I will give NSAIDs, plan for discharge and outpatient follow-up.      ____________________________________________   FINAL CLINICAL IMPRESSION(S) / ED DIAGNOSES    Final diagnoses:  Bad headache  Neck strain, initial encounter     ED Discharge Orders          Ordered    metoCLOPramide (REGLAN) 10 MG tablet  Every 6  hours PRN        07/06/21 1421    famotidine (PEPCID) 20 MG tablet  2 times daily        07/06/21 1421    naproxen (NAPROSYN) 500 MG tablet  2 times daily with meals        07/06/21 1421            Portions of this note were generated with dragon dictation software. Dictation errors may occur despite best attempts at proofreading.    Sharman Cheek, MD 07/06/21 1426

## 2021-07-06 NOTE — Discharge Instructions (Addendum)
Your lab tests and CT scan today were okay.

## 2021-11-18 ENCOUNTER — Ambulatory Visit: Payer: Self-pay | Admitting: Family Medicine

## 2021-11-18 ENCOUNTER — Other Ambulatory Visit: Payer: Self-pay

## 2021-11-18 DIAGNOSIS — Z113 Encounter for screening for infections with a predominantly sexual mode of transmission: Secondary | ICD-10-CM

## 2021-11-18 LAB — HEPATITIS B SURFACE ANTIGEN

## 2021-11-18 LAB — HM HIV SCREENING LAB: HM HIV Screening: NEGATIVE

## 2021-11-18 LAB — GRAM STAIN

## 2021-11-18 LAB — HM HEPATITIS C SCREENING LAB: HM Hepatitis Screen: NEGATIVE

## 2021-11-18 NOTE — Progress Notes (Signed)
[facility] Department ?STI clinic/screening visit ? ?Subjective:  ?Brandon Page is a 36 y.o. male being seen today for an STI screening visit. The patient reports they do not have symptoms.   ? ?Patient has the following medical conditions:  There are no problems to display for this patient. ? ? ? ?Chief Complaint  ?Patient presents with  ? SEXUALLY TRANSMITTED DISEASE  ?  Screening  ? ? ?HPI ? ?Patient reports here for screening, reports may have noticed some white discharge  ? ?Does the patient or their partner desires a pregnancy in the next year? No ? ?Screening for MPX risk: ?Does the patient have an unexplained rash? No ?Is the patient MSM? No ?Does the patient endorse multiple sex partners or anonymous sex partners? No ?Did the patient have close or sexual contact with a person diagnosed with MPX? No ?Has the patient traveled outside the [place] where MPX is endemic? No ?Is there a high clinical suspicion for MPX-- evidenced by one of the following No ? -Unlikely to be chickenpox ? -Lymphadenopathy ? -Rash that present in same phase of evolution on any given body part ? ? ?See flowsheet for further details and programmatic requirements.  ? ? ?The following portions of the patient's history were reviewed and updated as appropriate: allergies, current medications, past medical history, past social history, past surgical history and problem list. ? ?Objective:  ?There were no vitals filed for this visit. ? ?Physical Exam ?Constitutional:   ?   Appearance: Normal appearance.  ?HENT:  ?   Head: Normocephalic.  ?   Mouth/Throat:  ?   Mouth: Mucous membranes are moist.  ?   Pharynx: Oropharynx is clear. No oropharyngeal exudate.  ?Pulmonary:  ?   Effort: Pulmonary effort is normal.  ?Genitourinary: ?   Penis: Normal.   ?   Testes: Normal.  ?   Comments: No lice, nits, or pest, no lesions or odor discharge.  Denies pain or tenderness with paplation of testicles.  No lesions, ulcers or masses present.    ? ?Musculoskeletal:  ?   Cervical back: Normal range of motion and neck supple.  ?Lymphadenopathy:  ?   Cervical: No cervical adenopathy.  ?Skin: ?   General: Skin is warm and dry.  ?   Findings: No bruising, erythema, lesion or rash.  ?Neurological:  ?   General: No focal deficit present.  ?   Mental Status: He is alert and oriented to person, place, and time.  ?Psychiatric:     ?   Mood and Affect: Mood normal.     ?   Behavior: Behavior normal.  ? ? ? ? ?Assessment and Plan:  ?Brandon Page is a 36 y.o. male presenting to the [facility] Department for STI screening ? ?1. Screening examination for venereal disease ?Patient does not have STI symptoms ?Patient accepted all screenings including  gram stain,  oral, urethral CT/GC and bloodwork for HIV/RPR.  ?Patient meets criteria for HepB screening? Yes. Ordered? Yes ?Patient meets criteria for HepC screening? Yes. Ordered? Yes ?Recommended condom use with all sex ?Discussed importance of condom use for STI prevent ? ?Treat gram stain per standing order ?Discussed time line for State Lab results and that patient will be called with positive results and encouraged patient to call if he had not heard in 2 weeks ?Recommended returning for continued or worsening symptoms.   ?- Gonococcus culture ?- Gram stain ?- HBV Antigen/Antibody State Lab ?- HIV/HCV [place]  Lab ?- Syphilis Serology,  Lab ? ? ? ? ?Return for as needed. ? ?No future appointments. ? ?Junious Dresser, FNP ?

## 2021-11-18 NOTE — Progress Notes (Signed)
Pt here for STD screening.  Gram stain results reviewed, no treatment required per SO.  Leman Martinek M Kameo Bains, RN ° °

## 2021-11-23 LAB — GONOCOCCUS CULTURE

## 2022-03-17 ENCOUNTER — Ambulatory Visit: Payer: Self-pay | Admitting: Family Medicine

## 2022-03-17 ENCOUNTER — Encounter: Payer: Self-pay | Admitting: Family Medicine

## 2022-03-17 DIAGNOSIS — Z113 Encounter for screening for infections with a predominantly sexual mode of transmission: Secondary | ICD-10-CM

## 2022-03-17 LAB — HM HIV SCREENING LAB: HM HIV Screening: NEGATIVE

## 2022-03-17 LAB — HEPATITIS B SURFACE ANTIGEN

## 2022-03-17 LAB — HM HEPATITIS C SCREENING LAB: HM Hepatitis Screen: NEGATIVE

## 2022-03-17 NOTE — Progress Notes (Signed)
Pt here for STD screening.  Condoms declined.  Tanis Hensarling M Navil Kole, RN ° °

## 2022-03-17 NOTE — Progress Notes (Signed)
[facility] Department STI clinic/screening visit  Subjective:  Brandon Page is a 36 y.o. male being seen today for an STI screening visit. The patient reports they do not have symptoms.    Patient has the following medical conditions:  There are no problems to display for this patient.    Chief Complaint  Patient presents with   SEXUALLY TRANSMITTED DISEASE    Screening    HPI  Patient reports to clinic today for STD screening.  Patient is currently asymptomatic.    Does the patient or their partner desires a pregnancy in the next year? No  Screening for MPX risk: Does the patient have an unexplained rash? No Is the patient MSM? No Does the patient endorse multiple sex partners or anonymous sex partners? No Did the patient have close or sexual contact with a person diagnosed with MPX? No Has the patient traveled outside the [place] where MPX is endemic? No Is there a high clinical suspicion for MPX-- evidenced by one of the following No  -Unlikely to be chickenpox  -Lymphadenopathy  -Rash that present in same phase of evolution on any given body part   See flowsheet for further details and programmatic requirements.   Immunization History  Administered Date(s) Administered   Hepatitis A 01/13/2006   Hepatitis B 05/12/1998, 06/16/1998, 11/17/1998, 01/13/2006     The following portions of the patient's history were reviewed and updated as appropriate: allergies, current medications, past medical history, past social history, past surgical history and problem list.  Objective:  There were no vitals filed for this visit.  Physical Exam Constitutional:      Appearance: Normal appearance.  HENT:     Head: Normocephalic. No abrasion, masses or laceration. Hair is normal.     Right Ear: External ear normal.     Left Ear: External ear normal.     Nose: Nose normal.     Mouth/Throat:     Lips: No lesions.     Mouth: Mucous membranes are moist. No oral lesions.      Dentition: No dental caries.     Pharynx: No pharyngeal swelling, oropharyngeal exudate, posterior oropharyngeal erythema or uvula swelling.     Tonsils: No tonsillar exudate or tonsillar abscesses.  Eyes:     General: Lids are normal.        Right eye: No discharge.        Left eye: No discharge.     Conjunctiva/sclera: Conjunctivae normal.     Right eye: No exudate.    Left eye: No exudate. Abdominal:     General: Abdomen is flat.     Palpations: Abdomen is soft.     Tenderness: There is no abdominal tenderness. There is no rebound.  Genitourinary:    Pubic Area: No rash or pubic lice.      Penis: Normal and circumcised. No erythema or discharge.      Testes: Normal.        Right: Mass or tenderness not present.        Left: Mass or tenderness not present.     Rectum: Normal.     Comments: Discharge amount: None Color: None Musculoskeletal:     Cervical back: Full passive range of motion without pain, normal range of motion and neck supple.  Lymphadenopathy:     Cervical: No cervical adenopathy.     Right cervical: No superficial, deep or posterior cervical adenopathy.    Left cervical: No superficial, deep or posterior cervical  adenopathy.     Upper Body:     Right upper body: No supraclavicular, axillary or epitrochlear adenopathy.     Left upper body: No supraclavicular, axillary or epitrochlear adenopathy.     Lower Body: No right inguinal adenopathy. No left inguinal adenopathy.  Skin:    General: Skin is warm and dry.     Findings: No lesion or rash.  Neurological:     Mental Status: He is alert and oriented to person, place, and time.  Psychiatric:        Attention and Perception: Attention normal.        Mood and Affect: Mood normal.        Speech: Speech normal.        Behavior: Behavior normal. Behavior is cooperative.       Assessment and Plan:  Brandon Page is a 36 y.o. male presenting to the [facility] Department for STI  screening  1. Screening examination for venereal disease -36 year old male in clinic for STD screening.   -Patient does not have STI symptoms Patient accepted all screenings including oral GC, urine CT/GC and bloodwork for HIV/RPR.  Patient meets criteria for HepB screening? Yes. Ordered? Yes Patient meets criteria for HepC screening? Yes. Ordered? Yes Recommended condom use with all sex Discussed importance of condom use for STI prevent   Discussed time line for State Lab results and that patient will be called with positive results and encouraged patient to call if he had not heard in 2 weeks Recommended returning for continued or worsening symptoms.    -Total time spent 30 minutes.  - HIV/HCV [facility] - Syphilis Serology, [place] [facility] - HBV Antigen/Antibody State Lab - Chlamydia/GC NAA, Confirmation - Gonococcus culture     Return if symptoms worsen or fail to improve.   Glenna Fellows, FNP

## 2022-03-21 LAB — GONOCOCCUS CULTURE

## 2022-03-21 LAB — CHLAMYDIA/GC NAA, CONFIRMATION
Chlamydia trachomatis, NAA: NEGATIVE
Neisseria gonorrhoeae, NAA: NEGATIVE

## 2022-07-27 ENCOUNTER — Encounter: Payer: Self-pay | Admitting: Family Medicine

## 2022-07-27 ENCOUNTER — Ambulatory Visit: Payer: Self-pay | Admitting: Family Medicine

## 2022-07-27 DIAGNOSIS — Z113 Encounter for screening for infections with a predominantly sexual mode of transmission: Secondary | ICD-10-CM

## 2022-07-27 LAB — GRAM STAIN

## 2022-07-27 LAB — HM HIV SCREENING LAB: HM HIV Screening: NEGATIVE

## 2022-07-27 NOTE — Progress Notes (Signed)
Pt. seen for routine STI screening. Gram results reviewed with pt by  [facility] FNP. Condoms declined, no tx indicated.  Ebony Cargo, RN

## 2022-07-27 NOTE — Progress Notes (Signed)
[facility] Department STI clinic/screening visit  Subjective:  Brandon Page is a 36 y.o. male being seen today for an STI screening visit. The patient reports they do have symptoms.    Patient has the following medical conditions:  There are no problems to display for this patient.    Chief Complaint  Patient presents with   SEXUALLY TRANSMITTED DISEASE    Screening- patient complaining of discharge for the past 2 days     HPI  Patient reports to clinic for STI testing. Has had penile discharge x 1 week.   Last HIV test per patient/review of record was  Lab Results  Component Value Date   HMHIVSCREEN Negative - Validated 03/17/2022   No results found for: "HIV"  Does the patient or their partner desires a pregnancy in the next year? No  Screening for MPX risk: Does the patient have an unexplained rash? No Is the patient MSM? No Does the patient endorse multiple sex partners or anonymous sex partners? No Did the patient have close or sexual contact with a person diagnosed with MPX? No Has the patient traveled outside the [place] where MPX is endemic? No Is there a high clinical suspicion for MPX-- evidenced by one of the following No  -Unlikely to be chickenpox  -Lymphadenopathy  -Rash that present in same phase of evolution on any given body part   See flowsheet for further details and programmatic requirements.   Immunization History  Administered Date(s) Administered   Hepatitis A 01/13/2006   Hepatitis B 05/12/1998, 06/16/1998, 11/17/1998, 01/13/2006     The following portions of the patient's history were reviewed and updated as appropriate: allergies, current medications, past medical history, past social history, past surgical history and problem list.  Objective:  There were no vitals filed for this visit.  Physical Exam Constitutional:      Appearance: Normal appearance.  HENT:     Head: Normocephalic and atraumatic.     Comments: No nits or  hair loss    Mouth/Throat:     Mouth: Mucous membranes are moist. No oral lesions.     Pharynx: Oropharynx is clear. No oropharyngeal exudate or posterior oropharyngeal erythema.  Eyes:     General:        Right eye: No discharge.        Left eye: No discharge.     Conjunctiva/sclera:     Right eye: Right conjunctiva is not injected. No exudate.    Left eye: Left conjunctiva is not injected. No exudate. Pulmonary:     Effort: Pulmonary effort is normal.  Abdominal:     General: Abdomen is flat.     Palpations: Abdomen is soft. There is no hepatomegaly or mass.     Tenderness: There is no abdominal tenderness. There is no rebound.     Hernia: There is no hernia in the left inguinal area or right inguinal area.  Genitourinary:    Pubic Area: No rash or pubic lice (no nits).      Penis: Normal and uncircumcised. No tenderness, discharge, swelling or lesions.      Testes: Normal.     Epididymis:     Right: Normal. No mass or tenderness.     Left: Normal. No mass or tenderness.     Rectum: Normal. No tenderness (no lesions or discharge).     Comments: Penile Discharge Amount: none visualized Color:  N/A  Musculoskeletal:        General: Normal range of  motion.  Lymphadenopathy:     Head:     Right side of head: No preauricular or posterior auricular adenopathy.     Left side of head: No preauricular or posterior auricular adenopathy.     Cervical: No cervical adenopathy.     Upper Body:     Right upper body: No supraclavicular, axillary or epitrochlear adenopathy.     Left upper body: No supraclavicular, axillary or epitrochlear adenopathy.     Lower Body: No right inguinal adenopathy. No left inguinal adenopathy.  Skin:    General: Skin is warm and dry.     Findings: No lesion or rash.  Neurological:     Mental Status: He is alert and oriented to person, place, and time.  Psychiatric:        Mood and Affect: Mood normal.        Behavior: Behavior normal.        Assessment and Plan:  Brandon Page is a 36 y.o. male presenting to the [facility] Department for STI screening  1. Screening for venereal disease  - Gonococcus culture - Chlamydia/GC NAA, Confirmation - HIV [place] [facility] - Syphilis Serology, [place] [facility] - Gram stain   Patient does have STI symptoms Patient accepted all screenings including   Patient meets criteria for HepB screening? Yes. Ordered? No- done in July 2023 Patient meets criteria for HepC screening? Yes. Ordered? No- done in July 2023 Recommended condom use with all sex Discussed importance of condom use for STI prevent  Treat gram stain per standing order Discussed time line for State Lab results and that patient will be called with positive results and encouraged patient to call if he had not heard in 2 weeks Recommended returning for continued or worsening symptoms.   Return if symptoms worsen or fail to improve.  Total time spent 20 minutes.   Lenice Llamas, [place]

## 2022-08-02 ENCOUNTER — Telehealth: Payer: Self-pay

## 2022-08-02 ENCOUNTER — Ambulatory Visit: Payer: Medicaid Other

## 2022-08-02 DIAGNOSIS — A749 Chlamydial infection, unspecified: Secondary | ICD-10-CM

## 2022-08-02 MED ORDER — DOXYCYCLINE HYCLATE 100 MG PO TABS: 100.0000 mg | ORAL_TABLET | Freq: Two times a day (BID) | ORAL | 0 refills | Status: AC

## 2022-08-02 NOTE — Telephone Encounter (Signed)
Calling pt re positive chlamydia result from 07/27/22 urine specimen. (Result initially seen in [place] [facility]SS event, report received in [place] [facility]SS before [facility].) Pt needs tx appt.  Phone call to pt at [phone]. Pt confirmed password. Counseled pt re + CT result. Pt states NKA.    EDSS lab report printed to RN Clinic.  Tx appt scheduled for 08/02/22. (3 RN schedule)

## 2022-08-02 NOTE — Progress Notes (Addendum)
In nurse clinic for treatment of chlamydia.  Pts male partner accompanied him at his request.  Contact card given and she will make appt to be treated.  See STI test result/treatment flowsheet. Pt states no allergies.  The patient was dispensed Doxycycline 100 mg po bid x 7 day (#14 capsules) today. I provided counseling today regarding the medication. We discussed the medication, the side effects and when to call clinic. Patient given the opportunity to ask questions. Questions answered.     Appt reminder to call for appt for TOC 3 months from treatment.  Cherlynn Polo, RN

## 2022-08-03 LAB — GONOCOCCUS CULTURE: GC Culture Only: NEGATIVE

## 2022-08-03 LAB — CHLAMYDIA/GC NAA, CONFIRMATION
Chlamydia trachomatis, NAA: POSITIVE — AB
Neisseria gonorrhoeae, NAA: NEGATIVE

## 2022-08-03 LAB — C. TRACHOMATIS NAA, CONFIRM: C. trachomatis NAA, Confirm: POSITIVE — AB

## 2022-09-10 NOTE — Addendum Note (Signed)
Addended by: Cailee Blanke on: 09/10/2022 03:26 PM   Modules accepted: Orders  

## 2023-07-19 ENCOUNTER — Ambulatory Visit: Payer: Medicaid Other | Admitting: Internal Medicine

## 2023-07-19 DIAGNOSIS — Z91199 Patient's noncompliance with other medical treatment and regimen due to unspecified reason: Secondary | ICD-10-CM

## 2023-07-19 NOTE — Progress Notes (Unsigned)
Pt went to wrong location and will reschedule

## 2023-10-24 ENCOUNTER — Ambulatory Visit: Payer: Medicaid Other | Admitting: Nurse Practitioner

## 2023-10-24 DIAGNOSIS — A749 Chlamydial infection, unspecified: Secondary | ICD-10-CM

## 2023-10-24 DIAGNOSIS — Z113 Encounter for screening for infections with a predominantly sexual mode of transmission: Secondary | ICD-10-CM | POA: Diagnosis not present

## 2023-10-24 DIAGNOSIS — A549 Gonococcal infection, unspecified: Secondary | ICD-10-CM

## 2023-10-24 LAB — GRAM STAIN

## 2023-10-24 LAB — HM HIV SCREENING LAB: HM HIV Screening: NEGATIVE

## 2023-10-24 LAB — HM HEPATITIS C SCREENING LAB: HM Hepatitis Screen: NEGATIVE

## 2023-10-24 LAB — HEPATITIS B SURFACE ANTIGEN

## 2023-10-24 MED ORDER — DOXYCYCLINE HYCLATE 100 MG PO TABS: 100.0000 mg | ORAL_TABLET | Freq: Two times a day (BID) | ORAL | Status: AC

## 2023-10-24 MED ORDER — CEFTRIAXONE SODIUM 500 MG IJ SOLR
500.0000 mg | Freq: Once | INTRAMUSCULAR | Status: AC
  Administered 2023-10-24: 500 mg via INTRAMUSCULAR

## 2023-10-24 NOTE — Progress Notes (Unsigned)
 Pt is here for STD screening. Condoms given.The patient was given Ceftriaxone 500 mg Injection at the [facility] and dispensed doxycycline today. I provided counseling today regarding the medication. We discussed the medication, the side effects and when to call clinic. Patient given the opportunity to ask questions for any clarification.contact card  and Brochure given. Sonda Primes, RN.

## 2023-10-24 NOTE — Progress Notes (Unsigned)
 Marland Kitchen

## 2023-10-26 NOTE — Progress Notes (Signed)
 [facility] Department STI clinic [address] [place] [address] Main phone: [phone]  STI screening visit  Subjective:  Brandon Page is a 38 y.o. male being seen today for an STI screening visit. The patient reports they do have symptoms.    Patient has the following medical conditions:  There are no active problems to display for this patient.   Chief Complaint  Patient presents with   SEXUALLY TRANSMITTED DISEASE    discharge   Patient is a pleasant 38 y.o. male who presents to the clinic today requesting symptomatic STI testing. He indicates penile discharge he describes as "pus" from penis constant that has been present for about 7 days and began after last sexual encounter. He states the discharge has no odor and he also endorses mild tingling with urination.  Patient indicates last sex was 1 week ago. He reports 2 male partners in the last 2 months and practices oral and penile/vaginal penetrative sex. He reports condom use sometimes. Patient reports a history of gonorrhea and chlamydia 1-2 years ago.    STI screening history: Last HIV test per patient/review of record was  Lab Results  Component Value Date   HMHIVSCREEN Negative - Validated 07/27/2022    Last HEPC test per patient/review of record was  Lab Results  Component Value Date   HMHEPCSCREEN Negative-Validated 03/17/2022    Last HEPB test per patient/review of record was No components found for: "HMHEPBSCREEN"   Fertility: Does the patient or their partner desires a pregnancy in the next year? No  Screening for MPX risk: Does the patient have an unexplained rash? No Is the patient MSM? No Does the patient endorse multiple sex partners or anonymous sex partners? Yes Did the patient have close or sexual contact with a person diagnosed with MPX? No Has the patient traveled outside the [place] where MPX is endemic? No Is there a high clinical suspicion for MPX--  evidenced by one of the following No  -Unlikely to be chickenpox  -Lymphadenopathy  -Rash that present in same phase of evolution on any given body part   See flowsheet for further details and programmatic requirements.   Immunization History  Administered Date(s) Administered   Hepatitis A 01/13/2006   Hepatitis B 05/12/1998, 06/16/1998, 11/17/1998, 01/13/2006     The following portions of the patient's history were reviewed and updated as appropriate: allergies, current medications, past medical history, past social history, past surgical history and problem list.  Objective:  There were no vitals filed for this visit.  Physical Exam Nursing note reviewed. Chaperone present: Declined chaperone.  Constitutional:      Appearance: Normal appearance.  HENT:     Head: Normocephalic.     Salivary Glands: Right salivary gland is not diffusely enlarged or tender. Left salivary gland is not diffusely enlarged or tender.     Mouth/Throat:     Lips: Pink. No lesions.     Mouth: Mucous membranes are moist. No oral lesions.     Tongue: No lesions. Tongue does not deviate from midline.     Pharynx: Oropharynx is clear. Uvula midline. No oropharyngeal exudate or posterior oropharyngeal erythema.     Tonsils: No tonsillar exudate.  Eyes:     General:        Right eye: No discharge.        Left eye: No discharge.     Conjunctiva/sclera:     Right eye: Right conjunctiva is not injected. No exudate.  Left eye: Left conjunctiva is not injected. No exudate. Pulmonary:     Effort: Pulmonary effort is normal.  Genitourinary:    Pubic Area: No rash or pubic lice.      Penis: Uncircumcised. Discharge present. No tenderness, swelling or lesions.      Testes: Normal.     Epididymis:     Right: Normal. No mass or tenderness.     Left: Normal. No mass or tenderness.     Tanner stage (genital): 5.     Comments: Yellowish, thin, non-odorous discharge present from urethral opening.    Lymphadenopathy:     Head:     Right side of head: No submental, submandibular, tonsillar, preauricular or posterior auricular adenopathy.     Left side of head: No submental, submandibular, tonsillar, preauricular or posterior auricular adenopathy.     Cervical: No cervical adenopathy.     Right cervical: No superficial or posterior cervical adenopathy.    Left cervical: No superficial or posterior cervical adenopathy.     Upper Body:     Right upper body: No supraclavicular or axillary adenopathy.     Left upper body: No supraclavicular or axillary adenopathy.     Lower Body: Right inguinal adenopathy present. Left inguinal adenopathy present.  Skin:    General: Skin is warm and dry.     Findings: No lesion or rash.     Comments: Skin tone appropriate for ethnicity.   Neurological:     Mental Status: He is alert and oriented to person, place, and time.  Psychiatric:        Attention and Perception: Attention and perception normal.        Mood and Affect: Mood and affect normal.        Speech: Speech normal.        Behavior: Behavior normal. Behavior is cooperative.        Thought Content: Thought content normal.     Assessment and Plan:  Brandon Page is a 38 y.o. male presenting to the [facility] Department for STI screening  1. Screening for venereal disease (Primary)  - HBV Antigen/Antibody State Lab - HIV/HCV [place] [facility] - Syphilis Serology, [place] [facility] - Gonococcus culture - Gram stain - Chlamydia/GC NAA, Confirmation  2. Gonorrhea Intracellular gram negative diplococci positive on penile gram stain in office today. Treating per CDC STD guidelines as follows: - cefTRIAXone (ROCEPHIN) injection 500 mg  3. Chlamydia Patient with >2WBC/hpf on penile gram stain in office today. This could be related to the positive gonorrhea or it could be coinfection with Chlamydia. After shared-decision making conversation with patient, patient requested treatment  presumptively for Chlamydia as follows: - doxycycline (VIBRA-TABS) 100 MG tablet; Take 1 tablet (100 mg total) by mouth 2 (two) times daily for 7 days.  Patient does have STI symptoms Patient accepted all screenings including  urine GC/Chlamydia, and blood work for HIV/Syphilis. Patient meets criteria for HepB screening? Yes. Ordered? yes Patient meets criteria for HepC screening? Yes. Ordered? yes Recommended condom use with all sex Discussed importance of condom use for STI prevention  Treat positive test results per standing order. Discussed time line for State Lab results and that patient will be called with positive results and encouraged patient to call if he had not heard in 2 weeks Recommended repeat testing in 3 months with positive results. Recommended returning for continued or worsening symptoms.   Return if symptoms worsen or fail to improve.  No future appointments.  Total time with patient 30 minutes.   Edmonia James, NP

## 2023-10-28 LAB — CHLAMYDIA/GC NAA, CONFIRMATION
Chlamydia trachomatis, NAA: NEGATIVE
Neisseria gonorrhoeae, NAA: POSITIVE — AB

## 2023-10-28 LAB — N. GONORRHOEAE NAA, CONFIRM: N. gonorrhoeae NAA, Confirm: POSITIVE — AB

## 2023-10-28 LAB — GONOCOCCUS CULTURE

## 2023-11-14 ENCOUNTER — Ambulatory Visit: Admitting: Internal Medicine

## 2023-11-14 NOTE — Progress Notes (Signed)
 No show

## 2023-12-05 NOTE — Addendum Note (Signed)
 Addended by: Heywood Bene on: 12/05/2023 11:34 AM   Modules accepted: Orders

## 2024-03-08 ENCOUNTER — Encounter: Payer: Self-pay | Admitting: Nurse Practitioner

## 2024-03-08 ENCOUNTER — Ambulatory Visit: Admitting: Nurse Practitioner

## 2024-03-08 ENCOUNTER — Other Ambulatory Visit (HOSPITAL_COMMUNITY)
Admission: RE | Admit: 2024-03-08 | Discharge: 2024-03-08 | Disposition: A | Discharge status: Home / Self Care | Source: Ambulatory Visit | Attending: Nurse Practitioner | Admitting: Nurse Practitioner

## 2024-03-08 VITALS — BP 134/82 | HR 82 | Resp 16 | Ht 66.0 in | Wt 202.0 lb

## 2024-03-08 DIAGNOSIS — Z0001 Encounter for general adult medical examination with abnormal findings: Secondary | ICD-10-CM | POA: Diagnosis not present

## 2024-03-08 DIAGNOSIS — R319 Hematuria, unspecified: Secondary | ICD-10-CM

## 2024-03-08 DIAGNOSIS — K219 Gastro-esophageal reflux disease without esophagitis: Secondary | ICD-10-CM

## 2024-03-08 DIAGNOSIS — Z7689 Persons encountering health services in other specified circumstances: Secondary | ICD-10-CM

## 2024-03-08 DIAGNOSIS — Z114 Encounter for screening for human immunodeficiency virus [HIV]: Secondary | ICD-10-CM

## 2024-03-08 DIAGNOSIS — Z0000 Encounter for general adult medical examination without abnormal findings: Secondary | ICD-10-CM

## 2024-03-08 DIAGNOSIS — Z716 Tobacco abuse counseling: Secondary | ICD-10-CM | POA: Diagnosis not present

## 2024-03-08 DIAGNOSIS — Z131 Encounter for screening for diabetes mellitus: Secondary | ICD-10-CM

## 2024-03-08 DIAGNOSIS — Z130 Encounter for screening for diseases of the blood and blood-forming organs and certain disorders involving the immune mechanism: Secondary | ICD-10-CM

## 2024-03-08 DIAGNOSIS — Z113 Encounter for screening for infections with a predominantly sexual mode of transmission: Secondary | ICD-10-CM | POA: Diagnosis present

## 2024-03-08 DIAGNOSIS — Z13220 Encounter for screening for lipoid disorders: Secondary | ICD-10-CM

## 2024-03-08 DIAGNOSIS — Z1159 Encounter for screening for other viral diseases: Secondary | ICD-10-CM

## 2024-03-08 LAB — POCT URINALYSIS DIPSTICK
Appearance: NORMAL
Bilirubin, UA: NEGATIVE
Blood, UA: NEGATIVE
Glucose, UA: NEGATIVE
Ketones, UA: NEGATIVE
Leukocytes, UA: NEGATIVE
Nitrite, UA: NEGATIVE
Protein, UA: NEGATIVE
Spec Grav, UA: 1.025 (ref 1.010–1.025)
Urobilinogen, UA: 0.2 U/dL
pH, UA: 6 (ref 5.0–8.0)

## 2024-03-08 MED ORDER — PANTOPRAZOLE SODIUM 20 MG PO TBEC: 20.0000 mg | DELAYED_RELEASE_TABLET | Freq: Every day | ORAL | 3 refills | Status: DC

## 2024-03-08 NOTE — Progress Notes (Signed)
 Name: Brandon Page   MRN: [id number]    DOB: [birth year]   Date:03/08/2024       Progress Note  Subjective  Chief Complaint  Chief Complaint  Patient presents with   Establish Care    HPI  Discussed the use of AI scribe software for clinical note transcription with the patient, who gave verbal consent to proceed.  History of Present Illness Brandon Page. is a 38 year old male who presents to establish care and for STD screening.  He seeks a refill for pantoprazole , which he takes 20 mg daily for acid reflux. The medication is effective but could be stronger, as its effectiveness depends on timely intake. Symptoms are exacerbated by tomato-based foods, orange juice, and spicy foods, which he tries to avoid.  He experienced a recent episode of hematuria, noticing a few droplets of blood in his urine last week, which resolved the same day. He recalls a similar incident years ago but did not seek medical attention at that time. He associates the recent episode with increased alcohol consumption over the weekend. No pain or increased frequency of urination unless consuming alcohol.  He reports a sensation of having water in his right ear for the past six months, despite no recent water exposure. He has tried using peroxide without relief. No pain or ringing, but dizziness occurs if he shakes his head vigorously.  He reports that his last blood pressure reading was 134/82 mmHg. He attempts to maintain a balanced diet and has been trying to eat healthier, influenced by colleagues at his workplace.  He has a history of smoking and acknowledges the need to quit. 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 He typically drinks on weekends, consuming about six to seven shots on a 'happy Saturday' and less on other days.  He wears glasses but finds them uncomfortable and sometimes experiences headaches, suggesting a possible  issue with the prescription. He has not had a dental exam this year but had extensive dental work last year, including wisdom teeth extraction and cavity fillings.  He typically sleeps from 11 PM to 6 AM on weekdays, with variable sleep patterns on weekends. He engages in minimal physical activity, primarily stretching in the mornings.      Diet: tries to eat well balanced diet, also tries to avoid foods that worsen his GERD Exercise: stretching,  recommend 150 min of physical activity weekly   Sleep: 6-8 hours Last dental exam: last year Last eye exam: last year, glasses Waist Measurement : 43 inches  Depression: phq 9 is negative    03/08/2024    1:32 PM  Depression screen PHQ 2/9  Decreased Interest 0  Down, Depressed, Hopeless 0  PHQ - 2 Score 0  Altered sleeping 0  Tired, decreased energy 0  Change in appetite 0  Feeling bad or failure about yourself  0  Trouble concentrating 0  Moving slowly or fidgety/restless 0  Suicidal thoughts 0  PHQ-9 Score 0  Difficult doing work/chores Not difficult at all    Hypertension:  BP Readings from Last 3 Encounters:  03/08/24 134/82  07/06/21 (!) 142/102  07/17/19 (!) 155/77    Obesity: Wt Readings from Last 3 Encounters:  03/08/24 202 lb (91.6 kg)  07/06/21 200 lb (90.7 kg)  10/21/18 190 lb 0.6 oz (86.2 kg)   BMI Readings from Last 3 Encounters:  03/08/24 32.60 kg/m  07/06/21 32.28 kg/m  10/21/18 30.67 kg/m     Lipids:  No results found for: CHOL No results found for: HDL No results found for: LDLCALC No results found for: TRIG No results found for: CHOLHDL No results found for: LDLDIRECT Glucose:  Glucose, Bld  Date Value Ref Range Status  07/06/2021 101 (H) 70 - 99 mg/dL Final    Comment:    Glucose reference range applies only to samples taken after fasting for at least 8 hours.  07/17/2019 95 70 - 99 mg/dL Final  97/77/7979 897 (H) 70 - 99 mg/dL Final    Flowsheet Row Office Visit from  03/08/2024 in [facility]  AUDIT-C Score 4     Single STD testing and prevention (HIV/chl/gon/syphilis): ordered Hep C: ordered  Skin cancer: Discussed monitoring for atypical lesions Colorectal cancer: does not qualify Prostate cancer: does not qualify No results found for: PSA   Lung cancer:   Low Dose CT Chest recommended if Age 37-80 years, 30 pack-year currently smoking OR have quit w/in 15years. Patient does not qualify.   AAA:  The USPSTF recommends one-time screening with ultrasonography in men ages 39 to 11 years who have ever smoked ECG:  none  Vaccines:  HPV: up to at age 61 , ask insurance if age between 52-45  Shingrix: 48-64 yo and ask insurance if covered when patient above 22 yo Pneumonia:  educated and discussed with patient. Flu:  educated and discussed with patient.  Advanced Care Planning: A voluntary discussion about advance care planning including the explanation and discussion of advance directives.  Discussed health care proxy and Living will, and the patient was able to identify a health care proxy as mom.  Patient does not have a living will at present time. If patient does have living will, I have requested they bring this to the clinic to be scanned in to their chart.  Patient Active Problem List   Diagnosis Date Noted   Gastroesophageal reflux disease without esophagitis 03/08/2024    Past Surgical History:  Procedure Laterality Date   ANKLE SURGERY  right   LEG SURGERY  left    History reviewed. No pertinent family history.  Social History   Socioeconomic History   Marital status: Single    Spouse name: Not on file   Number of children: 5   Years of education: Not on file   Highest education level: Not on file  Occupational History   Not on file  Tobacco Use   Smoking status: Some Days    Types: Cigarettes   Smokeless tobacco: Never   Tobacco comments:    1 cig on weekends   Vaping Use   Vaping status:  Never Used  Substance and Sexual Activity   Alcohol use: Yes    Alcohol/week: 4.0 standard drinks of alcohol    Types: 4 Shots of liquor per week    Comment: weekends   Drug use: Yes    Frequency: 7.0 times per week    Types: Marijuana    Comment: daily   Sexual activity: Yes    Partners: Female    Comment: Condom sometimes  Other Topics Concern   Not on file  Social History Narrative   Not on file   Social Drivers of Health   Financial Resource Strain: Low Risk  (03/08/2024)   Overall Financial Resource Strain (CARDIA)    Difficulty of Paying Living Expenses: Not hard at all  Food Insecurity: No Food Insecurity (03/08/2024)   Hunger Vital  Sign    Worried About Programme researcher, broadcasting/film/video in the Last Year: Never true    Ran Out of Food in the Last Year: Never true  Transportation Needs: No Transportation Needs (03/08/2024)   PRAPARE - Administrator, Civil Service (Medical): No    Lack of Transportation (Non-Medical): No  Physical Activity: Inactive (03/08/2024)   Exercise Vital Sign    Days of Exercise per Week: 0 days    Minutes of Exercise per Session: 0 min  Stress: No Stress Concern Present (03/08/2024)   [employer] of Occupational Health - Occupational Stress Questionnaire    Feeling of Stress: Not at all  Social Connections: Socially Isolated (03/08/2024)   Social Connection and Isolation Panel    Frequency of Communication with Friends and Family: Twice a week    Frequency of Social Gatherings with Friends and Family: Twice a week    Attends Religious Services: Never    Database administrator or Organizations: No    Attends Banker Meetings: Never    Marital Status: Never married  Intimate Partner Violence: Not At Risk (03/08/2024)   Humiliation, Afraid, Rape, and Kick questionnaire    Fear of Current or Ex-Partner: No    Emotionally Abused: No    Physically Abused: No    Sexually Abused: No     Current Outpatient Medications:     albuterol  (PROVENTIL  HFA;VENTOLIN  HFA) 108 (90 Base) MCG/ACT inhaler, Inhale 2 puffs into the lungs every 6 (six) hours as needed for wheezing or shortness of breath., Disp: 1 Inhaler, Rfl: 2   aspirin-acetaminophen -caffeine (EXCEDRIN MIGRAINE) 250-250-65 MG per tablet, Take 2 tablets by mouth every 6 (six) hours as needed. For pain, Disp: , Rfl:    pantoprazole  (PROTONIX ) 20 MG tablet, Take 1 tablet (20 mg total) by mouth daily., Disp: 90 tablet, Rfl: 3  No Known Allergies   ROS  Constitutional: Negative for fever or weight change.  Respiratory: Negative for cough and shortness of breath.   Cardiovascular: Negative for chest pain or palpitations.  Gastrointestinal: Negative for abdominal pain, no bowel changes.  Musculoskeletal: Negative for gait problem or joint swelling.  Skin: Negative for rash.  Neurological: Negative for dizziness or headache.  No other specific complaints in a complete review of systems (except as listed in HPI above).    Objective  Vitals:   03/08/24 1333  BP: 134/82  Pulse: 82  Resp: 16  SpO2: 98%  Weight: 202 lb (91.6 kg)  Height: 5' 6 (1.676 m)    Body mass index is 32.6 kg/m.  Physical Exam Vitals reviewed.  Constitutional:      Appearance: Normal appearance.  HENT:     Head: Normocephalic.     Right Ear: Tympanic membrane normal.     Left Ear: Tympanic membrane normal.     Nose: Nose normal.  Eyes:     Extraocular Movements: Extraocular movements intact.     Conjunctiva/sclera: Conjunctivae normal.     Pupils: Pupils are equal, round, and reactive to light.  Neck:     Thyroid: No thyroid mass, thyromegaly or thyroid tenderness.  Cardiovascular:     Rate and Rhythm: Normal rate and regular rhythm.     Pulses: Normal pulses.     Heart sounds: Normal heart sounds.  Pulmonary:     Effort: Pulmonary effort is normal.     Breath sounds: Normal breath sounds.  Abdominal:     General: Bowel sounds are normal.  Palpations: Abdomen is  soft.  Musculoskeletal:        General: Normal range of motion.     Cervical back: Normal range of motion and neck supple.     Right lower leg: No edema.     Left lower leg: No edema.  Skin:    General: Skin is warm and dry.     Capillary Refill: Capillary refill takes less than 2 seconds.  Neurological:     General: No focal deficit present.     Mental Status: He is alert and oriented to person, place, and time. Mental status is at baseline.  Psychiatric:        Mood and Affect: Mood normal.        Behavior: Behavior normal.        Thought Content: Thought content normal.        Judgment: Judgment normal.      No results found for this or any previous visit (from the past 2160 hours).   Fall Risk:    03/08/2024    1:32 PM  Fall Risk   Falls in the past year? 0  Number falls in past yr: 0  Injury with Fall? 0  Risk for fall due to : No Fall Risks  Follow up Falls evaluation completed      Functional Status Survey: Is the patient deaf or have difficulty hearing?: No Does the patient have difficulty seeing, even when wearing glasses/contacts?: No Does the patient have difficulty concentrating, remembering, or making decisions?: No Does the patient have difficulty walking or climbing stairs?: No Does the patient have difficulty dressing or bathing?: No Does the patient have difficulty doing errands alone such as visiting a doctor's office or shopping?: No    Assessment & Plan  Problem List Items Addressed This Visit       Digestive   Gastroesophageal reflux disease without esophagitis   Relevant Medications   pantoprazole  (PROTONIX ) 20 MG tablet   Other Visit Diagnoses       Screen for STD (sexually transmitted disease)    -  Primary   Relevant Orders   Urine cytology ancillary only   Hepatitis C antibody   HIV Antibody (routine testing w rflx)   RPR     Annual physical exam       Relevant Orders   Urine cytology ancillary only   CBC with  Differential/Platelet   Comprehensive metabolic panel with GFR   Lipid panel   Hemoglobin A1c   Hepatitis C antibody   HIV Antibody (routine testing w rflx)   RPR   POCT urinalysis dipstick     Screening for deficiency anemia       Relevant Orders   CBC with Differential/Platelet     Screening for HIV without presence of risk factors       Relevant Orders   HIV Antibody (routine testing w rflx)     Screening for cholesterol level       Relevant Orders   Lipid panel     Screening for diabetes mellitus       Relevant Orders   Comprehensive metabolic panel with GFR   Hemoglobin A1c     Encounter for hepatitis C screening test for low risk patient       Relevant Orders   Hepatitis C antibody     Screening examination for STD (sexually transmitted disease)         Encounter to establish care  Hematuria, unspecified type       Relevant Orders   Urine cytology ancillary only   POCT urinalysis dipstick   PSA     Tobacco abuse counseling          Assessment and Plan Assessment & Plan Annual exam Establishing care with a new provider. - Perform physical examination - Conduct STD screening  Gastroesophageal Reflux Disease Chronic acid reflux managed with pantoprazole  20 mg daily. Symptoms exacerbated by tomato-based products, orange juice, and spicy foods. Effective when taken consistently, but symptoms recur if a dose is missed. - Refill pantoprazole  20 mg daily - Advise on dietary modifications to avoid trigger foods  Intermittent Hematuria Reported episode of hematuria last week without pain. Previous similar episode years ago. Possible association with recent increased alcohol consumption. - Perform urinalysis to check for blood or infection, urine dip was negative - Send urine sample for Chlamydia and gonorrhea testing  Borderline Elevated Blood Pressure Blood pressure at 134/82 mmHg, borderline elevated. No immediate need for medication, but lifestyle  modifications recommended to prevent progression. - Advise on dietary modifications to reduce salt intake - Encourage regular physical activity - Monitor blood pressure regularly  Tobacco Use Current smoker advised to quit due to health risks. - Advise to quit smoking      -Prostate cancer screening and PSA options (with potential risks and benefits of testing vs not testing) were discussed along with recent recs/guidelines. -USPSTF grade A and B recommendations reviewed with patient; age-appropriate recommendations, preventive care, screening tests, etc discussed and encouraged; healthy living encouraged; see AVS for patient education given to patient -Discussed importance of 150 minutes of physical activity weekly, eat two servings of fish weekly, eat one serving of tree nuts ( cashews, pistachios, pecans, almonds.SABRA) every other day, eat 6 servings of fruit/vegetables daily and drink plenty of water and avoid sweet beverages.  -Reviewed Health Maintenance: yes

## 2024-03-09 ENCOUNTER — Ambulatory Visit: Payer: Self-pay | Admitting: Nurse Practitioner

## 2024-03-09 LAB — CBC WITH DIFFERENTIAL/PLATELET
Absolute Lymphocytes: 2465 {cells}/uL (ref 850–3900)
Absolute Monocytes: 935 {cells}/uL (ref 200–950)
Basophils Absolute: 71 {cells}/uL (ref 0–200)
Basophils Relative: 0.8 %
Eosinophils Absolute: 160 {cells}/uL (ref 15–500)
Eosinophils Relative: 1.8 %
HCT: 45.8 % (ref 38.5–50.0)
Hemoglobin: 14.6 g/dL (ref 13.2–17.1)
MCH: 27 pg (ref 27.0–33.0)
MCHC: 31.9 g/dL — ABNORMAL LOW (ref 32.0–36.0)
MCV: 84.7 fL (ref 80.0–100.0)
MPV: 9.8 fL (ref 7.5–12.5)
Monocytes Relative: 10.5 %
Neutro Abs: 5269 {cells}/uL (ref 1500–7800)
Neutrophils Relative %: 59.2 %
Platelets: 312 Thousand/uL (ref 140–400)
RBC: 5.41 Million/uL (ref 4.20–5.80)
RDW: 12.5 % (ref 11.0–15.0)
Total Lymphocyte: 27.7 %
WBC: 8.9 Thousand/uL (ref 3.8–10.8)

## 2024-03-09 LAB — HEMOGLOBIN A1C
Hgb A1c MFr Bld: 5.7 % — ABNORMAL HIGH (ref <5.7)
Mean Plasma Glucose: 117 mg/dL
eAG (mmol/L): 6.5 mmol/L

## 2024-03-09 LAB — COMPREHENSIVE METABOLIC PANEL WITH GFR
AG Ratio: 2 (calc) (ref 1.0–2.5)
ALT: 35 U/L (ref 9–46)
AST: 25 U/L (ref 10–40)
Albumin: 4.9 g/dL (ref 3.6–5.1)
Alkaline phosphatase (APISO): 60 U/L (ref 36–130)
BUN: 16 mg/dL (ref 7–25)
CO2: 28 mmol/L (ref 20–32)
Calcium: 9.6 mg/dL (ref 8.6–10.3)
Chloride: 103 mmol/L (ref 98–110)
Creat: 1.02 mg/dL (ref 0.60–1.26)
Globulin: 2.5 g/dL (ref 1.9–3.7)
Glucose, Bld: 88 mg/dL (ref 65–99)
Potassium: 3.9 mmol/L (ref 3.5–5.3)
Sodium: 141 mmol/L (ref 135–146)
Total Bilirubin: 0.5 mg/dL (ref 0.2–1.2)
Total Protein: 7.4 g/dL (ref 6.1–8.1)
eGFR: 97 mL/min/1.73m2 (ref >=60)

## 2024-03-09 LAB — LIPID PANEL
Cholesterol: 211 mg/dL — ABNORMAL HIGH (ref <200)
HDL: 46 mg/dL (ref >=40)
LDL Cholesterol (Calc): 127 mg/dL — ABNORMAL HIGH
Non-HDL Cholesterol (Calc): 165 mg/dL — ABNORMAL HIGH (ref <130)
Total CHOL/HDL Ratio: 4.6 (calc) (ref <5.0)
Triglycerides: 235 mg/dL — ABNORMAL HIGH (ref <150)

## 2024-03-09 LAB — HIV ANTIBODY (ROUTINE TESTING W REFLEX): HIV 1&2 Ab, 4th Generation: NONREACTIVE

## 2024-03-09 LAB — PSA: PSA: 0.37 ng/mL (ref <=4.00)

## 2024-03-09 LAB — RPR: RPR Ser Ql: NONREACTIVE

## 2024-03-09 LAB — HEPATITIS C ANTIBODY: Hepatitis C Ab: NONREACTIVE

## 2024-03-12 LAB — URINE CYTOLOGY ANCILLARY ONLY
Chlamydia: NEGATIVE
Comment: NEGATIVE
Comment: NEGATIVE
Comment: NORMAL
Neisseria Gonorrhea: NEGATIVE
Trichomonas: NEGATIVE

## 2024-09-04 ENCOUNTER — Other Ambulatory Visit: Payer: Self-pay | Admitting: Nurse Practitioner

## 2024-09-04 DIAGNOSIS — K219 Gastro-esophageal reflux disease without esophagitis: Secondary | ICD-10-CM

## 2024-09-04 MED ORDER — PANTOPRAZOLE SODIUM 20 MG PO TBEC: 20.0000 mg | DELAYED_RELEASE_TABLET | Freq: Every day | ORAL | 0 refills | Status: AC

## 2024-09-04 NOTE — Telephone Encounter (Signed)
 Copied from CRM [id number]. Topic: Clinical - Medication Refill >> Sep 04, 2024  9:26 AM Amy B wrote: Medication:  pantoprazole  (PROTONIX ) 20 MG tablet  Has the patient contacted their pharmacy? No (Agent: If no, request that the patient contact the pharmacy for the refill. If patient does not wish to contact the pharmacy document the reason why and proceed with request.) (Agent: If yes, when and what did the pharmacy advise?)  This is the patient's preferred pharmacy:  [id number] [place], [place] - [address] AT [facility] OF [address] [place] [address] Phone: [phone] Fax: [phone]  Is this the correct pharmacy for this prescription? Yes If no, delete pharmacy and type the correct one.   Has the prescription been filled recently? No  Is the patient out of the medication? Yes  Has the patient been seen for an appointment in the last year OR does the patient have an upcoming appointment? Yes  Can we respond through MyChart? Yes  Agent: Please be advised that Rx refills may take up to 3 business days. We ask that you follow-up with your pharmacy.

## 2024-09-04 NOTE — Telephone Encounter (Signed)
 Copied from CRM [id number]. Topic: Referral - Request for Referral >> Sep 04, 2024  9:19 AM Amy B wrote: Did the patient discuss referral with their provider in the last year? No (If No - schedule appointment) (If Yes - send message)  Appointment offered? No  Type of order/referral and detailed reason for visit: Chiropractor  Preference of office, provider, location: [facility] in [place]  If referral order, have you been seen by this specialty before? No (If Yes, this issue or another issue? When? Where?  Can we respond through MyChart? Yes

## 2024-09-11 ENCOUNTER — Ambulatory Visit: Payer: Self-pay | Admitting: Nurse Practitioner

## 2024-09-11 ENCOUNTER — Telehealth: Payer: Self-pay

## 2024-09-11 ENCOUNTER — Encounter: Payer: Self-pay | Admitting: Nurse Practitioner

## 2024-09-11 ENCOUNTER — Ambulatory Visit: Admitting: Nurse Practitioner

## 2024-09-11 ENCOUNTER — Ambulatory Visit
Admission: RE | Admit: 2024-09-11 | Discharge: 2024-09-11 | Disposition: A | Source: Ambulatory Visit | Attending: Nurse Practitioner

## 2024-09-11 ENCOUNTER — Ambulatory Visit
Admission: RE | Admit: 2024-09-11 | Discharge: 2024-09-11 | Disposition: A | Discharge status: Home / Self Care | Attending: Nurse Practitioner | Admitting: Nurse Practitioner

## 2024-09-11 VITALS — BP 134/88 | HR 80 | Temp 98.0°F | Ht 66.0 in | Wt 211.0 lb

## 2024-09-11 DIAGNOSIS — K219 Gastro-esophageal reflux disease without esophagitis: Secondary | ICD-10-CM | POA: Diagnosis not present

## 2024-09-11 DIAGNOSIS — G8929 Other chronic pain: Secondary | ICD-10-CM | POA: Insufficient documentation

## 2024-09-11 DIAGNOSIS — E785 Hyperlipidemia, unspecified: Secondary | ICD-10-CM | POA: Diagnosis not present

## 2024-09-11 DIAGNOSIS — M545 Low back pain, unspecified: Secondary | ICD-10-CM | POA: Diagnosis not present

## 2024-09-11 MED ORDER — NAPROXEN 500 MG PO TABS: 500.0000 mg | ORAL_TABLET | Freq: Two times a day (BID) | ORAL | 0 refills | Status: AC

## 2024-09-11 NOTE — Telephone Encounter (Signed)
 Copied from CRM [id number]. Topic: Referral - Question >> Sep 11, 2024 12:11 PM Brandon Page wrote: Reason for CRM: Patient called in stating that  [facility] where referral was sent out for him today,does not accept medicaid.He would like to know if  referral could be sent to a location that does accept medicaid? He stated that he did contact [facility] Page on [address] ,and they do accept his insurance.

## 2024-09-11 NOTE — Progress Notes (Signed)
 "  BP 134/88   Pulse 80   Temp 98 F (36.7 C)   Ht 5' 6 (1.676 m)   Wt 211 lb (95.7 kg)   SpO2 97%   BMI 34.06 kg/m    Subjective:    Patient ID: Brandon Page, male    DOB: 25-Jul-1986, 39 y.o.   MRN: [id number]  HPI: Brandon Page is a 39 y.o. male  Chief Complaint  Patient presents with   Back Pain    Pt c/o chronic low back pain.    Discussed the use of AI scribe software for clinical note transcription with the patient, who gave verbal consent to proceed.  History of Present Illness Brandon MAMULA. is a 39 year old male who presents with low back pain.  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 - Pain localized to the lower back, occasionally radiates down the legs, especially when standing up or during sleep - No numbness or tingling - Legs frequently 'go to sleep' when sitting for extended periods, such as on the toilet - No prior imaging or chiropractic care - Tylenol  provides only marginal pain relief  Hyperlipidemia and prediabetes - Last recorded LDL 127 mg/dL - Triglycerides 764 mg/dL - J8r 4.2% - Informed of prediabetes diagnosis  Gastroesophageal reflux disease (gerd) - Takes pantoprazole  20 mg daily for GERD  Occupational factors - Works as a paediatric nurse, involving prolonged standing and bending, contributing to back pain         03/08/2024    1:32 PM  Depression screen PHQ 2/9  Decreased Interest 0  Down, Depressed, Hopeless 0  PHQ - 2 Score 0  Altered sleeping 0  Tired, decreased energy 0  Change in appetite 0  Feeling bad or failure about yourself  0  Trouble concentrating 0  Moving slowly or fidgety/restless 0  Suicidal thoughts 0  PHQ-9 Score 0   Difficult doing work/chores Not difficult at all     Data saved with a previous flowsheet row definition    Relevant past medical, surgical, family and social history reviewed and updated as indicated. Interim medical history since  our last visit reviewed. Allergies and medications reviewed and updated.  Review of Systems  Ten systems reviewed and is negative except as mentioned in HPI      Objective:      BP 134/88   Pulse 80   Temp 98 F (36.7 C)   Ht 5' 6 (1.676 m)   Wt 211 lb (95.7 kg)   SpO2 97%   BMI 34.06 kg/m    Wt Readings from Last 3 Encounters:  09/11/24 211 lb (95.7 kg)  03/08/24 202 lb (91.6 kg)  07/06/21 200 lb (90.7 kg)    Physical Exam GENERAL: Alert, cooperative, well developed, no acute distress HEENT: Normocephalic, normal oropharynx, moist mucous membranes CHEST: Clear to auscultation bilaterally, No wheezes, rhonchi, or crackles CARDIOVASCULAR: Normal heart rate and rhythm, S1 and S2 normal without murmurs ABDOMEN: Soft, non-tender, non-distended, without organomegaly, Normal bowel sounds EXTREMITIES: No cyanosis or edema MUSCULOSKELETAL: tenderness noted midline in lumbar spine NEUROLOGICAL: Cranial nerves grossly intact, Moves all extremities without gross motor or sensory deficit  Results for orders placed or performed in visit on 03/08/24  Urine cytology ancillary only   Collection Time: 03/08/24  1:54 PM  Result Value Ref Range   Neisseria Gonorrhea Negative    Chlamydia Negative    Trichomonas Negative  Comment Normal Reference Ranger Chlamydia - Negative    Comment      Normal Reference Range Neisseria Gonorrhea - Negative   Comment Normal Reference Range Trichomonas - Negative   POCT urinalysis dipstick   Collection Time: 03/08/24  2:06 PM  Result Value Ref Range   Color, UA Yellow    Clarity, UA Clear    Glucose, UA Negative Negative   Bilirubin, UA Negative    Ketones, UA Negative    Spec Grav, UA 1.025 1.010 - 1.025   Blood, UA Negative    pH, UA 6.0 5.0 - 8.0   Protein, UA Negative Negative   Urobilinogen, UA 0.2 0.2 or 1.0 E.U./dL   Nitrite, UA Negative    Leukocytes, UA Negative Negative   Appearance Normal    Odor Strong   CBC with  Differential/Platelet   Collection Time: 03/08/24  2:15 PM  Result Value Ref Range   WBC 8.9 3.8 - 10.8 Thousand/uL   RBC 5.41 4.20 - 5.80 Million/uL   Hemoglobin 14.6 13.2 - 17.1 g/dL   HCT 54.1 61.4 - 49.9 %   MCV 84.7 80.0 - 100.0 fL   MCH 27.0 27.0 - 33.0 pg   MCHC 31.9 (L) 32.0 - 36.0 g/dL   RDW 87.4 88.9 - 84.9 %   Platelets 312 140 - 400 Thousand/uL   MPV 9.8 7.5 - 12.5 fL   Neutro Abs 5,269 1,500 - 7,800 cells/uL   Absolute Lymphocytes 2,465 850 - 3,900 cells/uL   Absolute Monocytes 935 200 - 950 cells/uL   Eosinophils Absolute 160 15 - 500 cells/uL   Basophils Absolute 71 0 - 200 cells/uL   Neutrophils Relative % 59.2 %   Total Lymphocyte 27.7 %   Monocytes Relative 10.5 %   Eosinophils Relative 1.8 %   Basophils Relative 0.8 %  Comprehensive metabolic panel with GFR   Collection Time: 03/08/24  2:15 PM  Result Value Ref Range   Glucose, Bld 88 65 - 99 mg/dL   BUN 16 7 - 25 mg/dL   Creat 8.97 9.39 - 8.73 mg/dL   eGFR 97 > OR = 60 fO/fpw/8.26f7   BUN/Creatinine Ratio SEE NOTE: 6 - 22 (calc)   Sodium 141 135 - 146 mmol/L   Potassium 3.9 3.5 - 5.3 mmol/L   Chloride 103 98 - 110 mmol/L   CO2 28 20 - 32 mmol/L   Calcium 9.6 8.6 - 10.3 mg/dL   Total Protein 7.4 6.1 - 8.1 g/dL   Albumin 4.9 3.6 - 5.1 g/dL   Globulin 2.5 1.9 - 3.7 g/dL (calc)   AG Ratio 2.0 1.0 - 2.5 (calc)   Total Bilirubin 0.5 0.2 - 1.2 mg/dL   Alkaline phosphatase (APISO) 60 36 - 130 U/L   AST 25 10 - 40 U/L   ALT 35 9 - 46 U/L  Lipid panel   Collection Time: 03/08/24  2:15 PM  Result Value Ref Range   Cholesterol 211 (H) <200 mg/dL   HDL 46 > OR = 40 mg/dL   Triglycerides 764 (H) <150 mg/dL   LDL Cholesterol (Calc) 127 (H) mg/dL (calc)   Total CHOL/HDL Ratio 4.6 <5.0 (calc)   Non-HDL Cholesterol (Calc) 165 (H) <130 mg/dL (calc)  Hemoglobin J8r   Collection Time: 03/08/24  2:15 PM  Result Value Ref Range   Hgb A1c MFr Bld 5.7 (H) <5.7 %   Mean Plasma Glucose 117 mg/dL   eAG (mmol/L) 6.5  mmol/L  Hepatitis C antibody  Collection Time: 03/08/24  2:15 PM  Result Value Ref Range   Hepatitis C Ab NON-REACTIVE NON-REACTIVE  HIV Antibody (routine testing w rflx)   Collection Time: 03/08/24  2:15 PM  Result Value Ref Range   HIV FINAL INTERPRETATION     HIV 1&2 Ab, 4th Generation NON-REACTIVE NON-REACTIVE  RPR   Collection Time: 03/08/24  2:15 PM  Result Value Ref Range   RPR Ser Ql NON-REACTIVE NON-REACTIVE  PSA   Collection Time: 03/08/24  2:15 PM  Result Value Ref Range   PSA 0.37 < OR = 4.00 ng/mL          Assessment & Plan:   Problem List Items Addressed This Visit       Digestive   Gastroesophageal reflux disease without esophagitis     Other   Hyperlipidemia   Chronic bilateral low back pain without sciatica - Primary   Relevant Medications   naproxen  (NAPROSYN ) 500 MG tablet   Other Relevant Orders   DG Lumbar Spine Complete     Assessment and Plan Assessment & Plan Chronic low back pain Exacerbated by prolonged standing and bending, localized to the lower back with occasional radiation to the buttocks. No numbness or tingling, but legs go to sleep when sitting for extended periods. 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 No prior imaging or chiropractic evaluation. - Prescribed medication to be taken twice daily with food to reduce inflammation and pain. - Ordered x-ray of the lower back. - Provided referral to a chiropractor in [place].    Hyperlipidemia and prediabetes - Last recorded LDL 127 mg/dL - Triglycerides 764 mg/dL - J8r 4.2% - Informed of prediabetes diagnosis  Gastroesophageal reflux disease (gerd) - Takes pantoprazole  20 mg daily for GERD    Follow up plan: Return if symptoms worsen or fail to improve. "

## 2024-09-12 NOTE — Addendum Note (Signed)
 Addended by: YVONE WARREN BROCKS on: 09/12/2024 09:53 AM   Modules accepted: Orders

## 2025-03-12 ENCOUNTER — Encounter: Admitting: Nurse Practitioner
